# Patient Record
Sex: Male | Born: 1970 | Hispanic: No | Marital: Married | Smoking: Never smoker
Health system: Southern US, Community
[De-identification: ages and names within clinical notes are randomized; demographics above are authoritative.]

## PROBLEM LIST (undated history)

## (undated) DIAGNOSIS — R1084 Generalized abdominal pain: Secondary | ICD-10-CM

## (undated) DIAGNOSIS — K5909 Other constipation: Secondary | ICD-10-CM

## (undated) DIAGNOSIS — B009 Herpesviral infection, unspecified: Secondary | ICD-10-CM

## (undated) DIAGNOSIS — E785 Hyperlipidemia, unspecified: Secondary | ICD-10-CM

## (undated) DIAGNOSIS — R071 Chest pain on breathing: Secondary | ICD-10-CM

## (undated) DIAGNOSIS — K5900 Constipation, unspecified: Secondary | ICD-10-CM

## (undated) DIAGNOSIS — K219 Gastro-esophageal reflux disease without esophagitis: Secondary | ICD-10-CM

## (undated) DIAGNOSIS — M25569 Pain in unspecified knee: Secondary | ICD-10-CM

## (undated) DIAGNOSIS — M1710 Unilateral primary osteoarthritis, unspecified knee: Secondary | ICD-10-CM

## (undated) DIAGNOSIS — D473 Essential (hemorrhagic) thrombocythemia: Secondary | ICD-10-CM

## (undated) DIAGNOSIS — F411 Generalized anxiety disorder: Secondary | ICD-10-CM

## (undated) HISTORY — DX: Chest pain on breathing: R07.1

## (undated) HISTORY — DX: Gastro-esophageal reflux disease without esophagitis: K21.9

## (undated) HISTORY — DX: Constipation, unspecified: K59.00

## (undated) HISTORY — DX: Generalized anxiety disorder: F41.1

## (undated) HISTORY — PX: OTHER SURGICAL HISTORY: SHX169

## (undated) HISTORY — DX: Essential (hemorrhagic) thrombocythemia: D47.3

## (undated) HISTORY — DX: Other constipation: K59.09

## (undated) HISTORY — DX: Unilateral primary osteoarthritis, unspecified knee: M17.10

## (undated) HISTORY — DX: Pain in unspecified knee: M25.569

## (undated) HISTORY — DX: Generalized abdominal pain: R10.84

## (undated) HISTORY — DX: Hyperlipidemia, unspecified: E78.5

## (undated) HISTORY — DX: Herpesviral infection, unspecified: B00.9

---

## 1999-06-30 HISTORY — PX: ANTERIOR CRUCIATE LIGAMENT REPAIR: SHX115

## 2006-05-03 ENCOUNTER — Ambulatory Visit: Payer: Self-pay | Admitting: Internal Medicine

## 2006-05-14 ENCOUNTER — Ambulatory Visit: Payer: Self-pay | Admitting: Internal Medicine

## 2006-05-14 LAB — CONVERTED CEMR LAB
ALT: 13 units/L (ref 0–40)
Albumin: 4.2 g/dL (ref 3.5–5.2)
Basophils Absolute: 0.1 10*3/uL (ref 0.0–0.1)
Basophils Relative: 1.7 % — ABNORMAL HIGH (ref 0.0–1.0)
CO2: 33 meq/L — ABNORMAL HIGH (ref 19–32)
Cholesterol: 147 mg/dL (ref 0–200)
Creatinine, Ser: 1.3 mg/dL (ref 0.4–1.5)
Eosinophil percent: 2.8 % (ref 0.0–5.0)
HCT: 44.4 % (ref 39.0–52.0)
Hemoglobin: 15.2 g/dL (ref 13.0–17.0)
LDL Cholesterol: 89 mg/dL (ref 0–99)
Neutrophils Relative %: 57.1 % (ref 43.0–77.0)
Platelets: 167 10*3/uL (ref 150–400)
Potassium: 3.3 meq/L — ABNORMAL LOW (ref 3.5–5.1)
RBC: 4.55 M/uL (ref 4.22–5.81)
Sodium: 142 meq/L (ref 135–145)
TSH: 2.82 microintl units/mL (ref 0.35–5.50)
VLDL: 10 mg/dL (ref 0–40)
WBC: 5.4 10*3/uL (ref 4.5–10.5)

## 2006-05-26 ENCOUNTER — Ambulatory Visit: Payer: Self-pay | Admitting: Internal Medicine

## 2007-06-10 ENCOUNTER — Ambulatory Visit: Payer: Self-pay | Admitting: Internal Medicine

## 2007-06-10 DIAGNOSIS — B009 Herpesviral infection, unspecified: Secondary | ICD-10-CM | POA: Insufficient documentation

## 2007-06-10 DIAGNOSIS — R1084 Generalized abdominal pain: Secondary | ICD-10-CM

## 2007-06-10 DIAGNOSIS — K5909 Other constipation: Secondary | ICD-10-CM

## 2007-06-10 HISTORY — DX: Generalized abdominal pain: R10.84

## 2007-06-10 HISTORY — DX: Herpesviral infection, unspecified: B00.9

## 2007-06-10 HISTORY — DX: Other constipation: K59.09

## 2007-06-27 ENCOUNTER — Ambulatory Visit: Payer: Self-pay | Admitting: Cardiology

## 2007-07-01 ENCOUNTER — Telehealth: Payer: Self-pay | Admitting: Internal Medicine

## 2007-07-15 ENCOUNTER — Ambulatory Visit: Payer: Self-pay | Admitting: Internal Medicine

## 2007-07-15 DIAGNOSIS — M1710 Unilateral primary osteoarthritis, unspecified knee: Secondary | ICD-10-CM

## 2007-07-15 HISTORY — DX: Unilateral primary osteoarthritis, unspecified knee: M17.10

## 2007-10-14 ENCOUNTER — Ambulatory Visit: Payer: Self-pay | Admitting: Internal Medicine

## 2007-10-14 DIAGNOSIS — K219 Gastro-esophageal reflux disease without esophagitis: Secondary | ICD-10-CM | POA: Insufficient documentation

## 2007-10-14 HISTORY — DX: Gastro-esophageal reflux disease without esophagitis: K21.9

## 2007-10-31 ENCOUNTER — Encounter: Payer: Self-pay | Admitting: Internal Medicine

## 2007-11-28 ENCOUNTER — Ambulatory Visit: Payer: Self-pay | Admitting: Gastroenterology

## 2007-11-28 DIAGNOSIS — K5900 Constipation, unspecified: Secondary | ICD-10-CM

## 2007-11-28 HISTORY — DX: Constipation, unspecified: K59.00

## 2007-12-02 ENCOUNTER — Ambulatory Visit: Payer: Self-pay | Admitting: Gastroenterology

## 2007-12-02 ENCOUNTER — Encounter: Payer: Self-pay | Admitting: Gastroenterology

## 2007-12-05 ENCOUNTER — Encounter: Payer: Self-pay | Admitting: Gastroenterology

## 2007-12-06 ENCOUNTER — Telehealth: Payer: Self-pay | Admitting: Gastroenterology

## 2007-12-20 ENCOUNTER — Ambulatory Visit: Payer: Self-pay | Admitting: Gastroenterology

## 2007-12-20 LAB — CONVERTED CEMR LAB: OCCULT 1: NEGATIVE

## 2008-01-04 ENCOUNTER — Ambulatory Visit: Payer: Self-pay | Admitting: Internal Medicine

## 2008-03-09 ENCOUNTER — Encounter (INDEPENDENT_AMBULATORY_CARE_PROVIDER_SITE_OTHER): Payer: Self-pay | Admitting: Occupational Medicine

## 2008-05-02 ENCOUNTER — Ambulatory Visit: Payer: Self-pay | Admitting: Internal Medicine

## 2008-05-02 DIAGNOSIS — M25569 Pain in unspecified knee: Secondary | ICD-10-CM

## 2008-05-02 HISTORY — DX: Pain in unspecified knee: M25.569

## 2008-06-12 ENCOUNTER — Telehealth: Payer: Self-pay | Admitting: Internal Medicine

## 2008-09-24 ENCOUNTER — Ambulatory Visit: Payer: Self-pay | Admitting: Internal Medicine

## 2008-09-24 DIAGNOSIS — K1120 Sialoadenitis, unspecified: Secondary | ICD-10-CM | POA: Insufficient documentation

## 2008-10-08 ENCOUNTER — Ambulatory Visit: Payer: Self-pay | Admitting: Internal Medicine

## 2008-10-08 DIAGNOSIS — D2370 Other benign neoplasm of skin of unspecified lower limb, including hip: Secondary | ICD-10-CM | POA: Insufficient documentation

## 2008-10-26 ENCOUNTER — Ambulatory Visit: Payer: Self-pay | Admitting: Internal Medicine

## 2008-10-26 LAB — CONVERTED CEMR LAB
AST: 21 units/L (ref 0–37)
Albumin: 4 g/dL (ref 3.5–5.2)
Alkaline Phosphatase: 36 units/L — ABNORMAL LOW (ref 39–117)
BUN: 12 mg/dL (ref 6–23)
Bilirubin, Direct: 0.1 mg/dL (ref 0.0–0.3)
Blood in Urine, dipstick: NEGATIVE
Calcium: 9.3 mg/dL (ref 8.4–10.5)
Cholesterol: 144 mg/dL (ref 0–200)
Creatinine, Ser: 1.2 mg/dL (ref 0.4–1.5)
Eosinophils Absolute: 0.2 10*3/uL (ref 0.0–0.7)
Eosinophils Relative: 3.2 % (ref 0.0–5.0)
GFR calc non Af Amer: 71.23 mL/min (ref >60)
Glucose, Bld: 90 mg/dL (ref 70–99)
Hemoglobin: 14.6 g/dL (ref 13.0–17.0)
LDL Cholesterol: 103 mg/dL — ABNORMAL HIGH (ref 0–99)
Lymphocytes Relative: 30.8 % (ref 12.0–46.0)
Nitrite: NEGATIVE
Potassium: 4.2 meq/L (ref 3.5–5.1)
Protein, U semiquant: NEGATIVE
Total Bilirubin: 1 mg/dL (ref 0.3–1.2)
Total CHOL/HDL Ratio: 5
VLDL: 11.8 mg/dL (ref 0.0–40.0)
WBC: 5 10*3/uL (ref 4.5–10.5)
pH: 7

## 2008-11-02 ENCOUNTER — Ambulatory Visit: Payer: Self-pay | Admitting: Internal Medicine

## 2008-11-14 ENCOUNTER — Telehealth (INDEPENDENT_AMBULATORY_CARE_PROVIDER_SITE_OTHER): Payer: Self-pay | Admitting: *Deleted

## 2009-02-16 IMAGING — CT CT ABDOMEN W/ CM
2 of 6 series · 17 of 46 positions shown, 19 images · IV contrast (omnipaque)
Comparison: None.

CLINICAL DATA: Left-sided abdominal pain, constipation and diarrhea.
 ABDOMEN CT WITH CONTRAST:
TECHNIQUE: Multidetector CT imaging of the abdomen was performed following the standard protocol during bolus administration of intravenous contrast.
 Contrast:  125 cc Omnipaque 300.
TECHNIQUE: Multidetector CT imaging of the pelvis was performed following the standard protocol during bolus administration of intravenous contrast.

[Series 4: abd_pel_xxl 2.0 b10f st · axial · 0.73mm/px · z∈[-358,+42]mm · 14 of 438 slices shown, 16 images]
[im 19/438  soft-tissue]
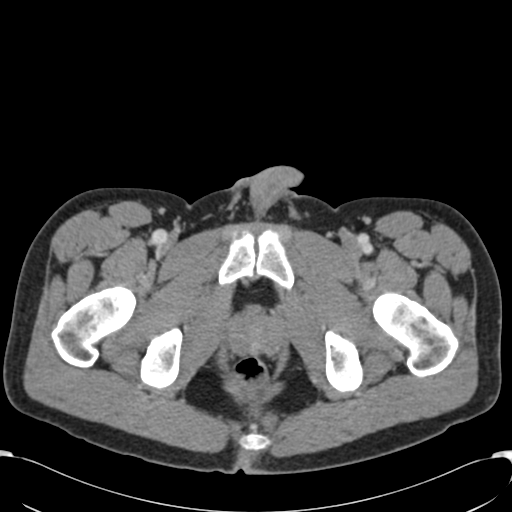
[im 19/438  bone]
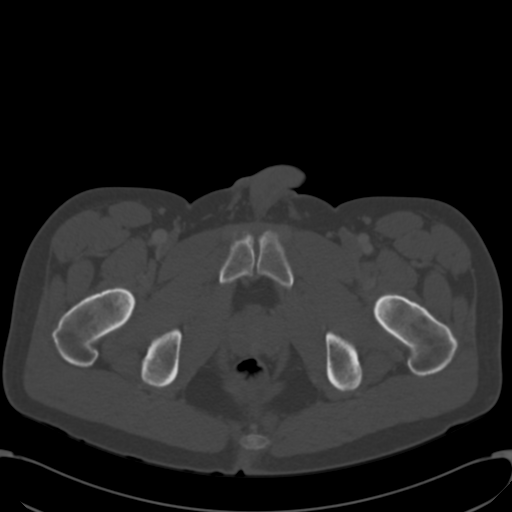
[im 55/438  soft-tissue]
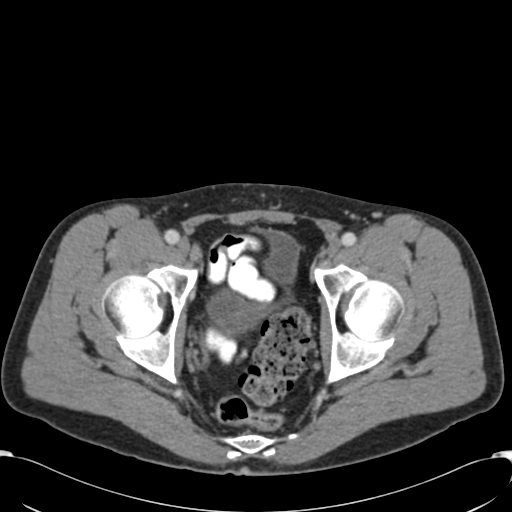
[im 92/438  soft-tissue]
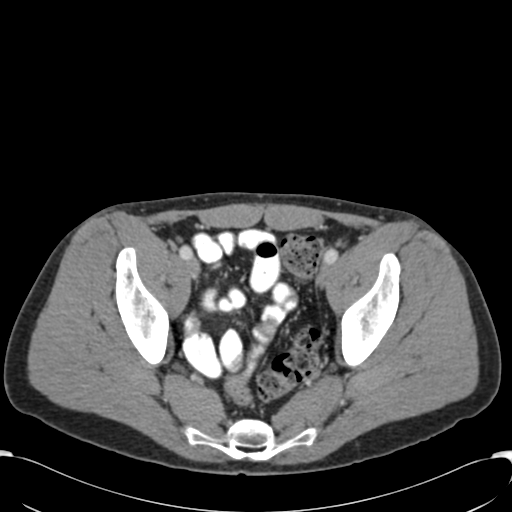
[im 110/438  soft-tissue]
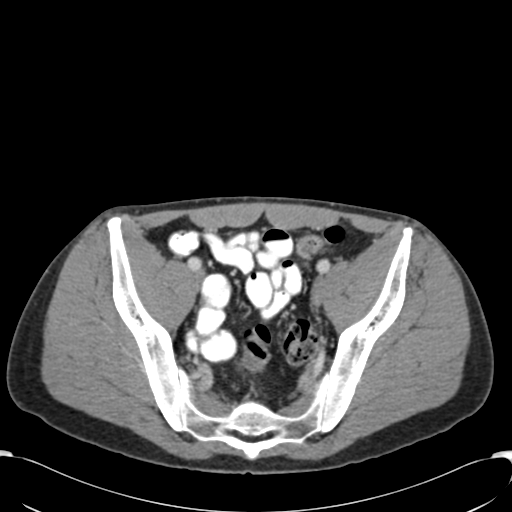
[im 146/438  soft-tissue]
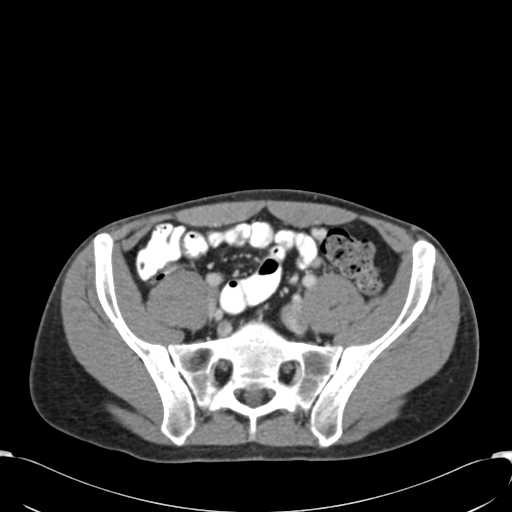
[im 183/438  soft-tissue]
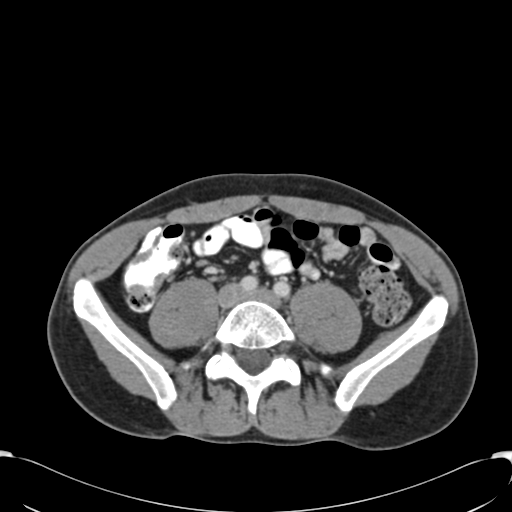
[im 201/438  soft-tissue]
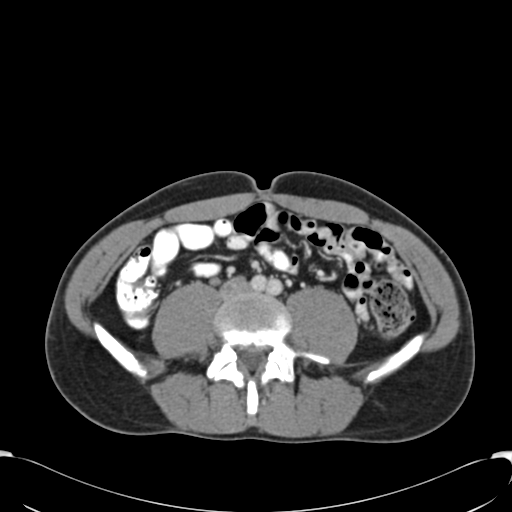
[im 237/438  soft-tissue]
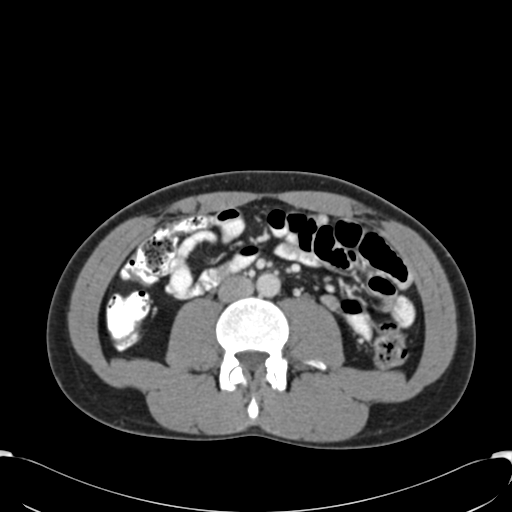
[im 255/438  soft-tissue]
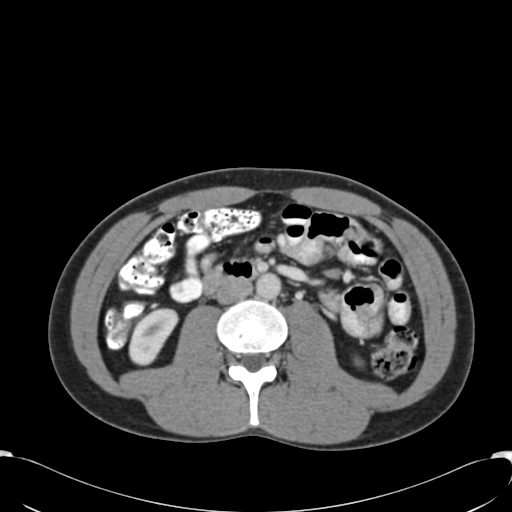
[im 255/438  bone]
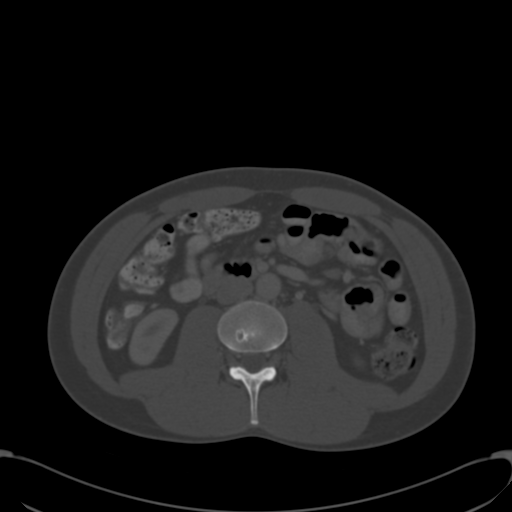
[im 292/438  soft-tissue]
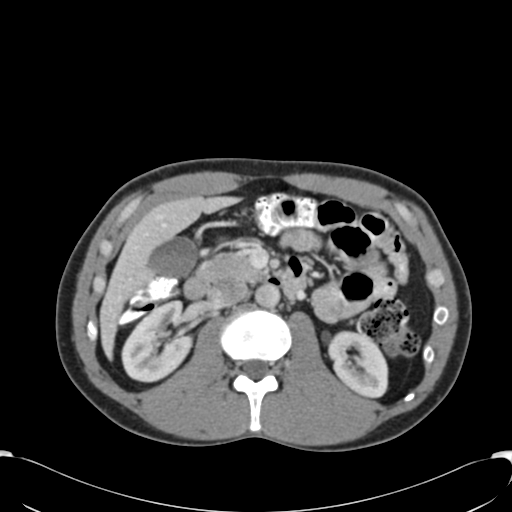
[im 328/438  soft-tissue]
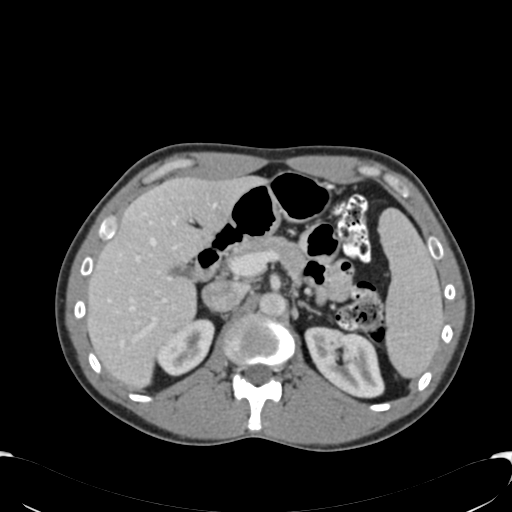
[im 346/438  soft-tissue]
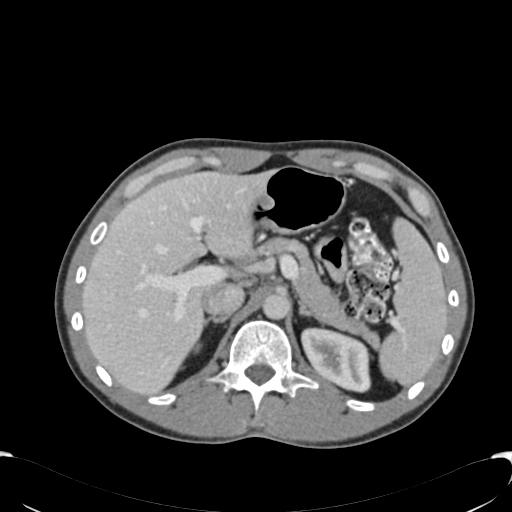
[im 383/438  soft-tissue]
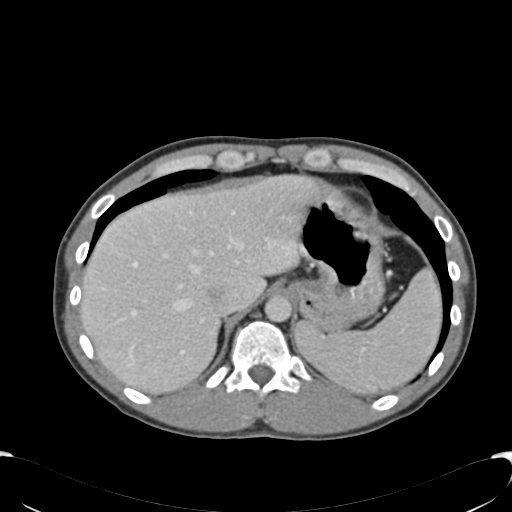
[im 419/438  soft-tissue]
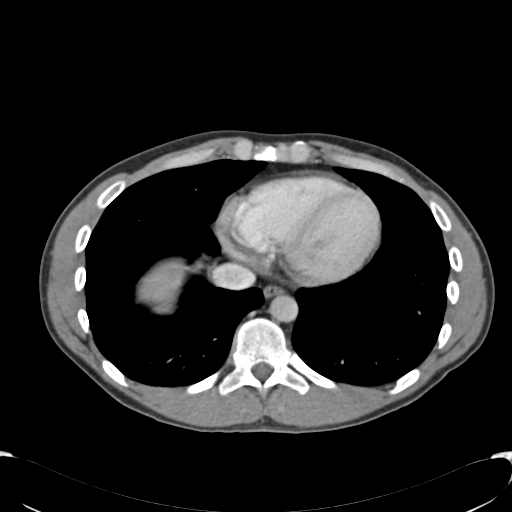

[Series 602: <mpr thick range> · coronal · 0.86mm/px · 3 of 72 slices shown]
[im 24/72  soft-tissue]
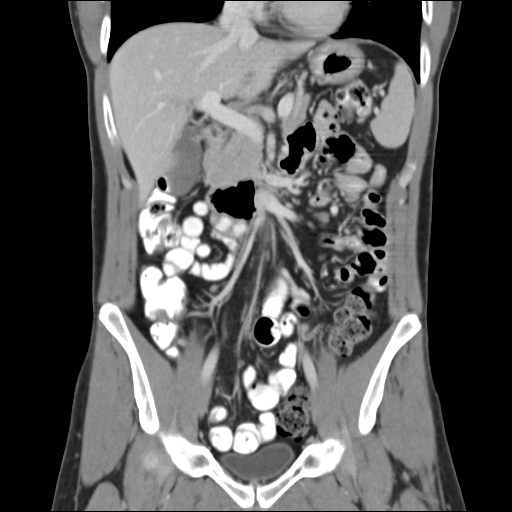
[im 32/72  soft-tissue]
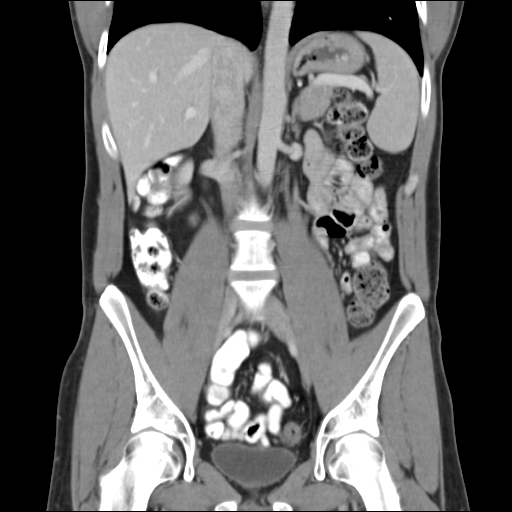
[im 40/72  soft-tissue]
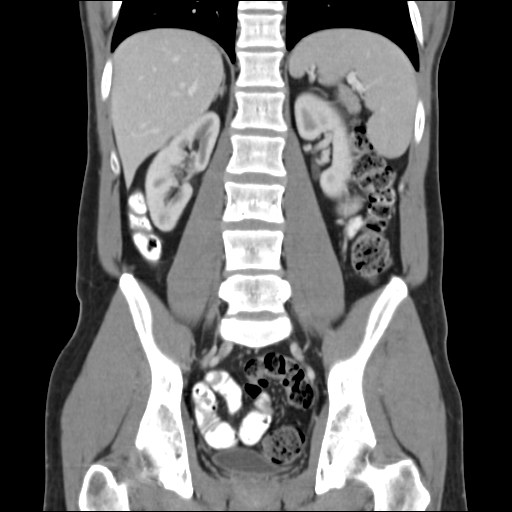

[17 of 46 positions shown; findings below may reference images not displayed]

FINDINGS: Lung bases are clear.  The liver enhances with no focal abnormality and no ductal dilatation is seen.  No calcified gallstones are noted.  The pancreas is normal in size and the pancreatic duct is not dilated.  The adrenal glands and spleen appear normal.  The kidneys enhance, and on delayed images, the pelvocaliceal systems appear normal.  The abdominal aorta is normal in caliber.
IMPRESSION: Negative CT of the abdomen.
 PELVIS CT WITH CONTRAST:
FINDINGS: The appendix is well seen and appears normal.  The urinary bladder is decompressed but no gross abnormality ix noted.  The prostate is normal in size.  No pelvic mass or fluid is seen and no adenopathy is noted.  There is feces throughout the colon but no colonic abnormality is evident.  Terminal ileum appears grossly normal.
IMPRESSION: No acute abnormality.  Terminal ileum and appendix appear normal.  A few small nodes in the right lower quadrant of questionable significance.

## 2009-05-09 ENCOUNTER — Ambulatory Visit: Payer: Self-pay | Admitting: Internal Medicine

## 2009-05-09 LAB — CONVERTED CEMR LAB
AST: 19 units/L (ref 0–37)
Albumin: 4.3 g/dL (ref 3.5–5.2)
Alkaline Phosphatase: 37 units/L — ABNORMAL LOW (ref 39–117)
Cholesterol: 165 mg/dL (ref 0–200)

## 2009-06-04 ENCOUNTER — Ambulatory Visit: Payer: Self-pay | Admitting: Internal Medicine

## 2009-06-04 DIAGNOSIS — E785 Hyperlipidemia, unspecified: Secondary | ICD-10-CM

## 2009-06-04 HISTORY — DX: Hyperlipidemia, unspecified: E78.5

## 2009-10-11 ENCOUNTER — Ambulatory Visit: Payer: Self-pay | Admitting: Internal Medicine

## 2009-10-11 DIAGNOSIS — R071 Chest pain on breathing: Secondary | ICD-10-CM | POA: Insufficient documentation

## 2009-10-11 HISTORY — DX: Chest pain on breathing: R07.1

## 2010-01-28 ENCOUNTER — Ambulatory Visit: Payer: Self-pay | Admitting: Internal Medicine

## 2010-01-28 LAB — CONVERTED CEMR LAB
ALT: 19 units/L (ref 0–53)
BUN: 14 mg/dL (ref 6–23)
Basophils Absolute: 0 10*3/uL (ref 0.0–0.1)
Basophils Relative: 0.6 % (ref 0.0–3.0)
Bilirubin Urine: NEGATIVE
Bilirubin, Direct: 0.2 mg/dL (ref 0.0–0.3)
Blood in Urine, dipstick: NEGATIVE
CO2: 29 meq/L (ref 19–32)
Calcium: 9.2 mg/dL (ref 8.4–10.5)
Eosinophils Relative: 3 % (ref 0.0–5.0)
GFR calc non Af Amer: 74.35 mL/min (ref >60)
Glucose, Urine, Semiquant: NEGATIVE
Ketones, urine, test strip: NEGATIVE
Lymphocytes Relative: 37.3 % (ref 12.0–46.0)
Lymphs Abs: 2.3 10*3/uL (ref 0.7–4.0)
MCHC: 35.3 g/dL (ref 30.0–36.0)
Monocytes Absolute: 0.5 10*3/uL (ref 0.1–1.0)
Monocytes Relative: 8.6 % (ref 3.0–12.0)
Platelets: 139 10*3/uL — ABNORMAL LOW (ref 150.0–400.0)
RDW: 13.1 % (ref 11.5–14.6)
Sodium: 143 meq/L (ref 135–145)
Total CHOL/HDL Ratio: 4
Total Protein: 6.8 g/dL (ref 6.0–8.3)
WBC Urine, dipstick: NEGATIVE

## 2010-02-04 ENCOUNTER — Ambulatory Visit: Payer: Self-pay | Admitting: Internal Medicine

## 2010-02-04 DIAGNOSIS — D473 Essential (hemorrhagic) thrombocythemia: Secondary | ICD-10-CM | POA: Insufficient documentation

## 2010-02-04 HISTORY — DX: Essential (hemorrhagic) thrombocythemia: D47.3

## 2010-06-06 ENCOUNTER — Telehealth: Payer: Self-pay | Admitting: Internal Medicine

## 2010-06-06 DIAGNOSIS — F411 Generalized anxiety disorder: Secondary | ICD-10-CM | POA: Insufficient documentation

## 2010-06-06 HISTORY — DX: Generalized anxiety disorder: F41.1

## 2010-07-29 NOTE — Assessment & Plan Note (Signed)
Summary: cpx/mm pt rsc/njr   Vital Signs:  Patient profile:   40 year old male Height:      74 inches Weight:      190 pounds BMI:     24.48 Temp:     98.2 degrees F oral Pulse rate:   72 / minute Resp:     12 per minute BP sitting:   110 / 76  (left arm)  Vitals Entered By: Willy Eddy, LPN (February 04, 2010 2:24 PM) CC: cpx Is Patient Diabetic? No   Primary Care Tocarra Gassen:  Darryll Capers MD  CC:  cpx.  History of Present Illness: The pt was asked about all immunizations, health maint. services that are appropriate to their age and was given guidance on diet exercize  and weight management Gerd STABLE LIPIDS AT GOAL  the platlet cought is slightly low discussed monitering no fm hx of bleeding disorders no signes of bleeding no suspect medications  Preventive Screening-Counseling & Management  Alcohol-Tobacco     Smoking Status: never     Passive Smoke Exposure: no  Problems Prior to Update: 1)  Chest Wall Pain, Anterior  (ICD-786.52) 2)  Hyperlipidemia, Mild, With Low Hdl  (ICD-272.4) 3)  Dermatofibroma, Leg, Right  (ICD-216.7) 4)  Sialadenitis, Right  (ICD-527.2) 5)  Knee Pain, Left, Chronic  (ICD-719.46) 6)  Routine General Medical Exam@health  Care [facility]  (ICD-V70.0) 7)  Constipation  (ICD-564.00) 8)  Gastroesophageal Reflux Disease, Severe  (ICD-530.81) 9)  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  (ICD-715.36) 10)  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) 11)  Abdominal Pain, Generalized  (ICD-789.07) 12)  Constipation, Chronic  (ICD-564.09)  Current Problems (verified): 1)  Chest Wall Pain, Anterior  (ICD-786.52) 2)  Hyperlipidemia, Mild, With Low Hdl  (ICD-272.4) 3)  Dermatofibroma, Leg, Right  (ICD-216.7) 4)  Sialadenitis, Right  (ICD-527.2) 5)  Knee Pain, Left, Chronic  (ICD-719.46) 6)  Routine General Medical Exam@health  Care [facility]  (ICD-V70.0) 7)  Constipation  (ICD-564.00) 8)  Gastroesophageal Reflux Disease, Severe   (ICD-530.81) 9)  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 1/2 By Mouth Daily 2)  Pantoprazole Sodium 40 Mg Tbec (Pantoprazole Sodium) .Marland Kitchen.. 1 Qod  30 Minutes Before Meal 3)  Caltrate 600+d 600-400 Mg-Unit Tabs (Calcium Carbonate-Vitamin D) .... One By Mouth Daily  Current Medications (verified): 1)  Valtrex 1 Gm  Tabs (Valacyclovir Hcl) .... 1/2 By Mouth Daily 2)  Protonix 20 Mg Tbec (Pantoprazole Sodium) .Marland Kitchen.. 1 Once Daily 3)  Caltrate 600+d 600-400 Mg-Unit Tabs (Calcium Carbonate-Vitamin D) .... 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  Risk Factors: Exercise: yes (11/28/2007)  Risk Factors: Smoking Status: never (02/04/2010) Passive Smoke Exposure: no (02/04/2010)  Past medical, surgical, family and social histories (including risk factors) reviewed, and no changes noted (except as noted below).  Past Medical History: Reviewed  history from 11/28/2007 and no changes required. 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 fibroid cyst removed from neck Knee Arthroscopy  Family History: Reviewed history from 10/14/2007 and no changes required. mother preleukemia  possible myeloproliferative   renal failure HTN MVP,Depression hyperlipidemia PUD, CVA,   early ADOM  father Family History Hypertension  Social History: Reviewed history from 11/28/2007 and no changes required. 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  Review of Systems  The patient denies anorexia, fever, weight loss, weight gain, vision loss, decreased hearing, hoarseness, chest pain, syncope, dyspnea on exertion, peripheral edema, prolonged cough, headaches, hemoptysis, abdominal pain, melena, hematochezia, severe indigestion/heartburn, hematuria, incontinence, genital sores, muscle weakness, suspicious skin lesions, transient blindness, difficulty walking, depression, unusual weight change, abnormal bleeding, enlarged lymph nodes, angioedema, and breast masses.    Physical Exam  General:  Well developed, well nourished, no acute distress. Head:  Normocephalic and atraumatic.male-pattern balding.   Ears:  Normal auditory acuity. Nose:  External nasal examination shows no deformity or inflammation. Nasal mucosa are pink and moist without lesions or exudates. Mouth:  No deformity or lesions, dentition normal. Neck:  No deformities, masses, or tenderness noted. Lungs:  Normal respiratory effort, chest expands symmetrically. Lungs are clear to auscultation, no crackles or wheezes. Heart:  Regular rate and rhythm; no murmurs, rubs,  or bruits. Abdomen:  Soft, nontender and nondistended. No masses, hepatosplenomegaly or hernias noted. Normal bowel sounds. Msk:  Symmetrical with no gross deformities. Normal posture. Pulses:  R and L carotid,radial,femoral,dorsalis pedis and posterior tibial pulses are full and equal bilaterally Extremities:  No clubbing, cyanosis, edema, or deformity noted with normal full range of motion of all joints.   Neurologic:  Alert and  oriented x4;   grossly normal neurologically.   Impression & Recommendations:  Problem # 1:  ROUTINE GENERAL MEDICAL EXAM@[facility]ALTH  CARE [facility] (ICD-V70.0) Assessment Unchanged  Td Booster: Historical (11/24/2005)   Flu Vax: Fluvax 3+ (05/09/2009)   Chol: 165 (05/09/2009)   HDL: 46.30 (05/09/2009)   LDL: 103 (10/26/2008)   TG: 59.0 (10/26/2008) TSH: 1.91 (10/26/2008)    Discussed using sunscreen, use of alcohol, drug use, self testicular exam, routine dental care, routine eye care, routine physical exam, seat belts, multiple vitamins, osteoporosis prevention, adequate calcium intake in diet, and recommendations for immunizations.  Discussed exercise and checking cholesterol.  Discussed gun safety, safe sex, and contraception. Also recommend checking PSA.  Problem # 2:  THROMBOCYTHEMIA (ICD-238.71) there was a slight drop in the platlets wth normal cbc will moniter in 6 months discussed wt pt  Complete Medication List: 1)  Valtrex 1 Gm Tabs (Valacyclovir hcl) .... 1/2 by mouth daily 2)  Protonix 20 Mg Tbec (Pantoprazole sodium) .Marland Kitchen.. 1 once daily 3)  Caltrate 600+d 600-400 Mg-unit Tabs (Calcium carbonate-vitamin d) .... One by mouth daily  Patient Instructions: 1)  Please schedule a follow-up appointment in 6 months. 2)  CBC w/ Diff prior to visit, ICD-9:thrombocythemia

## 2010-07-29 NOTE — Assessment & Plan Note (Signed)
Summary: ? CRACKED RIBS/CJR   Vital Signs:  Patient profile:   40 year old male Height:      74 inches Weight:      185 pounds BMI:     23.84 Temp:     98.2 degrees F oral Pulse rate:   72 / minute Resp:     14 per minute BP sitting:   110 / 70  (left arm)  Vitals Entered By: Willy Eddy, LPN (October 11, 2009 4:17 PM) CC: c/o pain in rt upper chest area -thinks maybe rib injury-    Primary Care Provider:  Darryll Capers MD  CC:  c/o pain in rt upper chest area -thinks maybe rib injury- .  History of Present Illness: started to feel sensation about three weeks ago after a bad cough/ uri no recurrent pain in the rib area sharp, occurs with turningm deep breath bending  the pain in the right anterior chest  Preventive Screening-Counseling & Management  Alcohol-Tobacco     Smoking Status: never     Passive Smoke Exposure: no  Problems Prior to Update: 1)  Hyperlipidemia, Mild, With Low Hdl  (ICD-272.4) 2)  Dermatofibroma, Leg, Right  (ICD-216.7) 3)  Sialadenitis, Right  (ICD-527.2) 4)  Knee Pain, Left, Chronic  (ICD-719.46) 5)  Routine General Medical Exam@health  Care Fac[facility]  (ICD-V70.0) 6)  Constipation  (ICD-564.00) 7)  Gastroesophageal Reflux Disease, Severe  (ICD-530.81) 8)  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  (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) 9)  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) 10)  Abdominal Pain, Generalized  (ICD-789.07) 11)  Constipation, Chronic  (ICD-564.09)  Current Problems (verified): 1)  Hyperlipidemia, Mild, With Low Hdl  (ICD-272.4) 2)  Dermatofibroma, Leg, Right  (ICD-216.7) 3)  Sialadenitis, Right  (ICD-527.2) 4)  Knee Pain, Left, Chronic  (ICD-719.46) 5)  Routine General Medical Exam@health  Care [facility]  (ICD-V70.0) 6)  Constipation  (ICD-564.00) 7)  Gastroesophageal Reflux Disease, Severe  (ICD-530.81) 8)  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 1/2 By Mouth Daily 2)  Pantoprazole Sodium 40 Mg Tbec (Pantoprazole Sodium) .Marland Kitchen.. 1 Qod  30 Minutes Before Meal 3)  Caltrate 600+d 600-400 Mg-Unit Tabs (Calcium Carbonate-Vitamin D) .... One By Mouth Daily  Current Medications (verified): 1)  Valtrex 1 Gm  Tabs (Valacyclovir Hcl) .... 1/2 By Mouth Daily 2)  Pantoprazole Sodium 40 Mg Tbec (Pantoprazole Sodium) .Marland Kitchen.. 1 Qod  30 Minutes Before Meal 3)  Caltrate 600+d 600-400 Mg-Unit Tabs (Calcium Carbonate-Vitamin D) .... 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  Risk Factors: Exercise: yes (11/28/2007)  Risk Factors: Smoking Status: never (10/11/2009) Passive Smoke Exposure: no (10/11/2009)  Past medical, surgical, family and social histories (including risk factors) reviewed, and no changes noted (except as noted below).  Past Medical History: Reviewed history from 11/28/2007 and no changes required. HSV bilateral reflux of the  ureters as child  Past Surgical History: Reviewed history from 11/28/2007 and no changes required. fibroid cyst removed from neck Knee Arthroscopy  Family History: Reviewed history from 10/14/2007 and no changes required. mother preleukemia  possible myeloproliferative  renal failure HTN MVP,Depression hyperlipidemia PUD, CVA,   early ADOM  father Family  History Hypertension  Social History: Reviewed history from 11/28/2007 and no changes required. 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  Review of Systems  The patient denies anorexia, fever, weight loss, weight gain, vision loss, decreased hearing, hoarseness, chest pain, syncope, dyspnea on exertion, peripheral edema, prolonged cough, headaches, hemoptysis, abdominal pain, melena, hematochezia, severe indigestion/heartburn, hematuria, incontinence, genital sores, muscle weakness, suspicious skin lesions, transient blindness, difficulty walking, depression, unusual weight change, abnormal bleeding, enlarged lymph nodes, angioedema, and breast masses.    Physical Exam  Head:  Normocephalic and atraumatic. Ears:  Normal auditory acuity. Nose:  External nasal examination shows no deformity or inflammation. Nasal mucosa are pink and moist without lesions or exudates. Neck:  No deformities, masses, or tenderness noted. Chest Wall:  tender at 5th rib anterioiry Lungs:  Normal respiratory effort, chest expands symmetrically. Lungs are clear to auscultation, no crackles or wheezes. Heart:  Regular rate and rhythm; no murmurs, rubs,  or bruits.   Impression & Recommendations:  Problem # 1:  CHEST WALL PAIN, ANTERIOR (ICD-786.52) Informed consent obtained and then the rib areawas prepped in a sterile manor and 20 mg depo and 1/2 cc 1% lidocaine injected into the space. After care discussed. Pt tolerated procedure well.  Orders: Trigger Point Injection (1 or 2 muscles) (16109) Depo-Medrol 20mg  (J1020)  Reviewed EKG (see interpretation) and treatment options. Patient instructed to call for worsening pain, or new symptoms.   Complete Medication List: 1)  Valtrex 1 Gm Tabs (Valacyclovir hcl) .... 1/2 by mouth daily 2)  Pantoprazole Sodium 40 Mg Tbec  (Pantoprazole sodium) .Marland Kitchen.. 1 qod  30 minutes before meal 3)  Caltrate 600+d 600-400 Mg-unit Tabs (Calcium carbonate-vitamin d) .... One by mouth daily

## 2010-07-29 NOTE — Progress Notes (Signed)
  Phone Note Call from Patient   Caller: Patient Call For: Stacie Glaze MD Reason for Call: Acute Illness Summary of Call: Pt would like to have a referral to a phychologist, please. [phone] Initial call taken by: Lynann Beaver CMA AAMA,  June 06, 2010 10:11 AM  Follow-up for Phone Call        he can make his own appointment with jennifer for dr Dellia Cloud- jennifer's number is [phone] Follow-up by: Willy Eddy, LPN,  June 06, 2010 10:34 AM  Additional Follow-up for Phone Call Additional follow up Details #1::        Pt is asking for the referral for referral for insurance purposes.  Would like [place] to do it. Additional Follow-up by: Lynann Beaver CMA AAMA,  June 06, 2010 10:36 AM  New Problems: ANXIETY STATE, UNSPECIFIED (ICD-300.00)   New Problems: ANXIETY STATE, UNSPECIFIED (ICD-300.00)

## 2010-08-07 ENCOUNTER — Other Ambulatory Visit (INDEPENDENT_AMBULATORY_CARE_PROVIDER_SITE_OTHER): Payer: BC Managed Care – PPO | Admitting: Internal Medicine

## 2010-08-07 DIAGNOSIS — D473 Essential (hemorrhagic) thrombocythemia: Secondary | ICD-10-CM

## 2010-08-07 LAB — CBC WITH DIFFERENTIAL/PLATELET
Basophils Relative: 0.7 % (ref 0.0–3.0)
Eosinophils Relative: 2.6 % (ref 0.0–5.0)
HCT: 41.6 % (ref 39.0–52.0)
Hemoglobin: 14.6 g/dL (ref 13.0–17.0)
Lymphs Abs: 1.7 10*3/uL (ref 0.7–4.0)
Monocytes Relative: 6.7 % (ref 3.0–12.0)
Neutro Abs: 2.6 10*3/uL (ref 1.4–7.7)
RBC: 4.24 Mil/uL (ref 4.22–5.81)
RDW: 13 % (ref 11.5–14.6)
WBC: 4.8 10*3/uL (ref 4.5–10.5)

## 2010-08-12 ENCOUNTER — Encounter: Payer: Self-pay | Admitting: Internal Medicine

## 2010-08-12 ENCOUNTER — Encounter: Payer: Self-pay | Admitting: *Deleted

## 2010-08-14 ENCOUNTER — Encounter: Payer: Self-pay | Admitting: Internal Medicine

## 2010-08-14 ENCOUNTER — Ambulatory Visit (INDEPENDENT_AMBULATORY_CARE_PROVIDER_SITE_OTHER): Payer: BC Managed Care – PPO | Admitting: Internal Medicine

## 2010-08-14 VITALS — BP 110/70 | HR 72 | Temp 98.0°F | Resp 14 | Ht 74.0 in | Wt 191.0 lb

## 2010-08-14 DIAGNOSIS — R071 Chest pain on breathing: Secondary | ICD-10-CM

## 2010-08-14 DIAGNOSIS — F411 Generalized anxiety disorder: Secondary | ICD-10-CM

## 2010-08-14 DIAGNOSIS — R079 Chest pain, unspecified: Secondary | ICD-10-CM

## 2010-08-14 DIAGNOSIS — Z566 Other physical and mental strain related to work: Secondary | ICD-10-CM

## 2010-08-14 DIAGNOSIS — D473 Essential (hemorrhagic) thrombocythemia: Secondary | ICD-10-CM

## 2010-08-14 DIAGNOSIS — Z5689 Other problems related to employment: Secondary | ICD-10-CM

## 2010-08-14 NOTE — Assessment & Plan Note (Addendum)
Patient presents for followup of laboratory values for thrombocythemia at the connected in the last 2 years.   his platelet count remained stable in the 1:30 range compared to a prior reading of 139 Baseline platelets prior to the diagnoses were 169.  Of note his hemoglobin is normal at 14.5 and his hematocrit is normal at 41 white count is normal with a normal differential his white cells and this has been a previous stable finding.  the patient is not on an aspirin currently we will add an 81 mg low-dose aspirin daily monitor his platelet count in 6 months at the time of his complete physical and we discussed potential differential diagnosis for this problem no other cell population has been affected at this point to monitoring as her best.

## 2010-08-14 NOTE — Assessment & Plan Note (Addendum)
He has atypical chest pain in the left chest nonradiating at rest he has been under increased stress has a new father and also he is up to 10 year this year an EKG will be performed if it is normal reassurance will be given if there is abnormality referral for a stress test will be ordered he is seeking counseling at this point will monitor for resolution of chest pain the chest pain is nonexertional. The EKG showed a single rare PVC otherwise was normal sinus rhythm and all the leads

## 2010-08-14 NOTE — Progress Notes (Signed)
  Subjective:    Patient ID: Keith Benton, male    DOB: 1971-05-03, 40 y.o.   MRN: [id number]  HPI this 40 year old white male who presents to the clinic for followup of essential thrombocythemia that was detected over one year ago we have been monitoring his platelets and they are stable. He has no symptoms specifically no bleeding disorders or any family history of bleeding disorders 2 years ago his platelet count was normal in the 170 range.   He also has increased stressors in his life with a new child and he is a 14 year this year at work.  He is at requested a referral to psychology for counseling.   Patient also has experienced atypical chest pain as left anterior chest this is at rest this is nonexertional it does not happen with  exercise    Review of Systems  Constitutional: Negative for fever and fatigue.  HENT: Negative for hearing loss, congestion, neck pain and postnasal drip.   Eyes: Negative for discharge, redness and visual disturbance.  Respiratory: Negative for cough, shortness of breath and wheezing.   Cardiovascular: Negative for leg swelling.  Gastrointestinal: Negative for abdominal pain, constipation and abdominal distention.  Genitourinary: Negative for urgency and frequency.  Musculoskeletal: Negative for joint swelling and arthralgias.  Skin: Negative for color change and rash.  Neurological: Negative for weakness and light-headedness.  Hematological: Negative for adenopathy.  Psychiatric/Behavioral: Negative for behavioral problems.       Past Medical History  Diagnosis Date  . Abdominal pain, generalized 06/10/2007  . Anxiety state, unspecified 06/06/2010  . CHEST WALL PAIN, ANTERIOR 10/11/2009  . CONSTIPATION, CHRONIC 06/10/2007  . CONSTIPATION 11/28/2007  . GASTROESOPHAGEAL REFLUX DISEASE, SEVERE 10/14/2007  . Herpes simplex without mention of complication 06/10/2007  . HYPERLIPIDEMIA, MILD, WITH LOW HDL 06/04/2009  . KNEE PAIN, LEFT, CHRONIC 05/02/2008   . LOC OSTEOARTHROS NOT SPEC PRIM/SEC LOWER LEG 07/15/2007  . THROMBOCYTHEMIA 02/04/2010   Past Surgical History  Procedure Date  . Fibroid cyst removed from neck   . Anterior cruciate ligament repair 2001    right    reports that he has never smoked. He does not have any smokeless tobacco history on file. He reports that he drinks about 1.8 ounces of alcohol per week. He reports that he does not use illicit drugs. family history includes Depression in his mother; Diabetes in his mother; Heart disease in his mother; Hyperlipidemia in his mother; Hypertension in his mother; Irregular heart beat in his father; Kidney disease in his mother; Kidney failure in an unspecified family member; and Stroke in his mother.     Objective:   Physical Exam  Constitutional: He is oriented to person, place, and time. He appears well-developed and well-nourished.  HENT:  Head: Normocephalic and atraumatic.  Eyes: Conjunctivae are normal. Pupils are equal, round, and reactive to light.  Neck: Normal range of motion. Neck supple.  Cardiovascular: Normal rate and regular rhythm.   Pulmonary/Chest: Effort normal and breath sounds normal.  Abdominal: Soft. Bowel sounds are normal.  Musculoskeletal: Normal range of motion.        Left anterior chest wall tenderness  Neurological: He is alert and oriented to person, place, and time.  Skin: Skin is warm and dry.  Psychiatric: He has a normal mood and affect. His behavior is normal.          Assessment & Plan:   see problem focused

## 2010-08-14 NOTE — Assessment & Plan Note (Signed)
Is increased stressors at work has asked for referral to psychology for discussion with the counselor we have referred him to Dr. Caralyn Guile

## 2010-09-02 ENCOUNTER — Ambulatory Visit: Payer: BC Managed Care – PPO | Admitting: Psychology

## 2010-09-04 ENCOUNTER — Ambulatory Visit (INDEPENDENT_AMBULATORY_CARE_PROVIDER_SITE_OTHER): Payer: BC Managed Care – PPO | Admitting: Psychology

## 2010-09-04 DIAGNOSIS — F411 Generalized anxiety disorder: Secondary | ICD-10-CM

## 2010-09-11 ENCOUNTER — Ambulatory Visit (INDEPENDENT_AMBULATORY_CARE_PROVIDER_SITE_OTHER): Payer: BC Managed Care – PPO | Admitting: Psychology

## 2010-09-11 DIAGNOSIS — F411 Generalized anxiety disorder: Secondary | ICD-10-CM

## 2010-09-25 ENCOUNTER — Ambulatory Visit (INDEPENDENT_AMBULATORY_CARE_PROVIDER_SITE_OTHER): Payer: BC Managed Care – PPO | Admitting: Psychology

## 2010-09-25 DIAGNOSIS — F411 Generalized anxiety disorder: Secondary | ICD-10-CM

## 2010-10-02 ENCOUNTER — Ambulatory Visit (INDEPENDENT_AMBULATORY_CARE_PROVIDER_SITE_OTHER): Payer: BC Managed Care – PPO | Admitting: Psychology

## 2010-10-02 DIAGNOSIS — F411 Generalized anxiety disorder: Secondary | ICD-10-CM

## 2010-10-09 ENCOUNTER — Encounter: Payer: Self-pay | Admitting: Family Medicine

## 2010-10-09 ENCOUNTER — Ambulatory Visit (INDEPENDENT_AMBULATORY_CARE_PROVIDER_SITE_OTHER): Payer: BC Managed Care – PPO | Admitting: Family Medicine

## 2010-10-09 VITALS — BP 110/70 | Temp 98.9°F

## 2010-10-09 DIAGNOSIS — K1120 Sialoadenitis, unspecified: Secondary | ICD-10-CM

## 2010-10-09 MED ORDER — CEPHALEXIN 500 MG PO CAPS: 500.0000 mg | ORAL_CAPSULE | Freq: Three times a day (TID) | ORAL | Status: AC

## 2010-10-09 NOTE — Progress Notes (Signed)
  Subjective:    Patient ID: Keith Benton, male    DOB: 21-May-1971, 40 y.o.   MRN: [id number]  HPI Recurrent right submandibular swelling. Has had several similar occurences in the past. Most recently 2 years ago. Has responded in the past to Keflex. No erythema or warmth or other indicators of active infection. His first episode of right submandibular swelling was in childhood. He apparently one point had some type of benign cyst removed. He denies any fever chills or other complaints   Review of Systems  Constitutional: Negative for fever and chills.  HENT: Negative for sore throat and trouble swallowing.   Skin: Negative for rash.  Hematological: Negative for adenopathy.       Objective:   Physical Exam  Constitutional: He appears well-developed and well-nourished. No distress.  HENT:  Head: Normocephalic and atraumatic.  Right Ear: External ear normal.  Left Ear: External ear normal.  Mouth/Throat: Oropharynx is clear and moist. No oropharyngeal exudate.  Neck: Neck supple.       Edema Right Submandibular Region. No Erythema or Warmth. Right Submandibular Gland Appears to Be Edematous. No Other Masses Palpated. No Parotid Masses.  Cardiovascular: Normal rate, regular rhythm and normal heart sounds.   Pulmonary/Chest: Effort normal and breath sounds normal. He has no wheezes. He has no rales.  Lymphadenopathy:    He has no cervical adenopathy.          Assessment & Plan:  Right submandibular gland swelling. No signs of abscess at this time. Keflex 500 mg 3 times a day for 10 days. Consider ENT referral if no better in 2 weeks

## 2010-10-09 NOTE — Patient Instructions (Signed)
Sialadenitis (Inflammation of the Salivary Glands) Sialadenitis is an inflammation (soreness) of the salivary glands. The parotid is the main salivary gland. It lies behind the angle of the jaw below the ear. The saliva produced comes out of a tiny opening (duct) inside the cheek on either side. This is usually at the level of the upper back teeth. If it is swollen, the ear is pushed up and out. This helps tell this condition apart from a simple lymph gland infection (swollen glands) in the same area. Mumps has mostly disappeared since the start of immunization against mumps. Now the most common cause of parotitis is germ (bacterial) infection or inflammation of the lymphatics (the lymph channels). The other major salivary gland is located in the floor of the mouth. Smaller salivary glands are located in the mouth. This includes the:  Lips.  Lining of the mouth.   Pharynx.  Hard palate (front part of the roof of the mouth).   The salivary glands do many things including:  Lubrication.   Breaking down food.   Production of hormones and antibodies (to protect against germs which may cause illness).   Help with the sense of taste.  ACUTE BACTERIAL SIALADENITIS This is a sudden inflammatory response to bacterial infection. This causes redness, pain, swelling and tenderness over the infected gland. In the past, it was common in dehydrated and debilitated patients often following an operation. It is now more commonly seen:  After radiotherapy.   In patients with poor immune systems.  Treatment is:  The correction of fluid balance (rehydration).   Medicine that kill germs (antibiotics).   Pain relief.  CHRONIC RECURRENT SIALADENITIS This refers to repeated episodes of discomfort and swelling of one of the salivary glands. It often occurs after eating. Chronic sialadenitis is usually less painful. It is associated with recurrent enlargement of a salivary gland, often following meals, and  typically with an absence of redness. The chronic form of the disease often is associated with conditions linked to decreased salivary flow, rather than dehydration (loss of body fluids). These conditions include:  A stone, or concretion, formed in the gallbladder, kidneys, or other parts of the body (calculi).   Salivary stasis.   A change in the fluid and electrolyte (the salts in your body fluids) makeup of the gland.  It is treated with:  Gland massage.   Methods to stimulate the flow of saliva, (for example, lemon juice).   Antibiotics if required.  Surgery to remove the gland is possible, but its benefits need to be balanced against risks.  VIRAL SIALADENITIS Several viruses infect the salivary glands. Some of these include the mumps virus that commonly infects the parotid gland. Other viruses causing problems are:  The HIV virus.   Herpes.   Some of the influenza ("flu") viruses.  RECURRENT SIALADENITIS IN CHILDREN This condition is thought to be due to swelling or ballooning of the ducts. It results in the same symptoms as acute bacterial parotitis. It is usually caused by germs (bacteria). It is often treated using penicillin. It may get well without treatment. Surgery is usually not required. TUBERCULOUS SIALADENITIS The salivary glands may become infected with the same bacteria causing tuberculosis ("TB"). Treatment is with anti-tuberculous antibiotic therapy. OTHER UNCOMMON CAUSES OF SIALADENITIS   Sjogren's syndrome is a condition in which arthritis is associated with a decrease in activity of the glands of the body that produce saliva and tears. The diagnosis is made with blood tests or by examination of  a piece of tissue from the inside of the lip. Some people with this condition are bothered by:   A dry mouth.   Intermittent salivary gland enlargement.   Atypical mycobacteria is a germ similar to tuberculosis. It often infects children. It is often resistant to  antibiotic treatment. It may require surgical treatment to remove the infected salivary gland.   Actinomycosis is an infection of the parotid gland that may also involve the overlying skin. The diagnosis is made by detecting granules of sulphur produced by the bacteria on microscopic examination. Treatment is a prolonged course of penicillin for up to one year.   Nutritional causes include vitamin deficiencies and bulimia.   Diabetes and problems with your thyroid.   Obesity, cirrhosis, and malabsorption are some metabolic causes.  HOME CARE INSTRUCTIONS  Apply ice bags every 2 hours for 15 minutes, while awake, to the sore gland for 24 hours, then as directed by your caregiver. Place the ice in a plastic bag with a towel around it to prevent frostbite to the skin.   Only take over-the-counter or prescription medicines for pain, discomfort, or fever as directed by your caregiver.  SEEK IMMEDIATE MEDICAL CARE IF:  There is increased pain or swelling in your gland that is not controlled with medicine.   An oral temperature above 101 develops, not controlled by medicine.   You develop difficulty opening your mouth, swallowing or speaking.  Document Released: 12/05/2001 Document Re-Released: 09/09/2009 [facility] Patient Information 2011 [place], [place].

## 2010-10-10 ENCOUNTER — Ambulatory Visit (INDEPENDENT_AMBULATORY_CARE_PROVIDER_SITE_OTHER): Payer: BC Managed Care – PPO | Admitting: Psychology

## 2010-10-10 DIAGNOSIS — F411 Generalized anxiety disorder: Secondary | ICD-10-CM

## 2010-10-18 ENCOUNTER — Other Ambulatory Visit: Payer: Self-pay | Admitting: Internal Medicine

## 2010-10-22 ENCOUNTER — Ambulatory Visit (INDEPENDENT_AMBULATORY_CARE_PROVIDER_SITE_OTHER): Payer: BC Managed Care – PPO | Admitting: Psychology

## 2010-10-22 DIAGNOSIS — F411 Generalized anxiety disorder: Secondary | ICD-10-CM

## 2010-11-03 ENCOUNTER — Ambulatory Visit (INDEPENDENT_AMBULATORY_CARE_PROVIDER_SITE_OTHER): Payer: BC Managed Care – PPO | Admitting: Psychology

## 2010-11-03 DIAGNOSIS — F411 Generalized anxiety disorder: Secondary | ICD-10-CM

## 2010-11-05 ENCOUNTER — Other Ambulatory Visit (INDEPENDENT_AMBULATORY_CARE_PROVIDER_SITE_OTHER): Payer: BC Managed Care – PPO | Admitting: Internal Medicine

## 2010-11-05 DIAGNOSIS — D473 Essential (hemorrhagic) thrombocythemia: Secondary | ICD-10-CM

## 2010-11-05 LAB — CBC WITH DIFFERENTIAL/PLATELET
Basophils Relative: 0.6 % (ref 0.0–3.0)
Eosinophils Relative: 3.5 % (ref 0.0–5.0)
HCT: 41.4 % (ref 39.0–52.0)
Hemoglobin: 14.6 g/dL (ref 13.0–17.0)
MCV: 98.4 fl (ref 78.0–100.0)
Monocytes Absolute: 0.4 10*3/uL (ref 0.1–1.0)
Neutrophils Relative %: 55.7 % (ref 43.0–77.0)
RBC: 4.21 Mil/uL — ABNORMAL LOW (ref 4.22–5.81)
WBC: 5.6 10*3/uL (ref 4.5–10.5)

## 2010-11-12 ENCOUNTER — Encounter: Payer: Self-pay | Admitting: Internal Medicine

## 2010-11-12 ENCOUNTER — Ambulatory Visit (INDEPENDENT_AMBULATORY_CARE_PROVIDER_SITE_OTHER): Payer: BC Managed Care – PPO | Admitting: Internal Medicine

## 2010-11-12 VITALS — BP 120/70 | HR 68 | Temp 98.2°F | Resp 14 | Ht 74.0 in | Wt 188.0 lb

## 2010-11-12 DIAGNOSIS — F411 Generalized anxiety disorder: Secondary | ICD-10-CM

## 2010-11-12 DIAGNOSIS — K219 Gastro-esophageal reflux disease without esophagitis: Secondary | ICD-10-CM

## 2010-11-12 DIAGNOSIS — R220 Localized swelling, mass and lump, head: Secondary | ICD-10-CM

## 2010-11-12 DIAGNOSIS — D473 Essential (hemorrhagic) thrombocythemia: Secondary | ICD-10-CM

## 2010-11-12 NOTE — Progress Notes (Signed)
  Subjective:    Patient ID: Keith Benton, male    DOB: [birth year], 40 y.o.   MRN: [id number]  HPI The pt has been seen for thrombocytopenia and was placed on ASA The pt has a lump in his salivary gland The patient has recurrent mass or sense of fullness and he has right salivary gland (submandibular) he was treated initially over a year ago with an antibiotic and a fullness in the parotid resolved he had recurrent symptoms was treated again with an antibiotic by my partner and the symptoms have not fully resolved.      Review of Systems  Constitutional: Negative for fever and fatigue.  HENT: Negative for hearing loss, congestion, neck pain and postnasal drip.   Eyes: Negative for discharge, redness and visual disturbance.  Respiratory: Negative for cough, shortness of breath and wheezing.   Cardiovascular: Negative for leg swelling.  Gastrointestinal: Negative for abdominal pain, constipation and abdominal distention.  Genitourinary: Negative for urgency and frequency.  Musculoskeletal: Negative for joint swelling and arthralgias.  Skin: Negative for color change and rash.  Neurological: Negative for weakness and light-headedness.  Hematological: Negative for adenopathy.  Psychiatric/Behavioral: Negative for behavioral problems.   Past Medical History  Diagnosis Date  . Abdominal pain, generalized 06/10/2007  . Anxiety state, unspecified 06/06/2010  . CHEST WALL PAIN, ANTERIOR 10/11/2009  . CONSTIPATION, CHRONIC 06/10/2007  . CONSTIPATION 11/28/2007  . GASTROESOPHAGEAL REFLUX DISEASE, SEVERE 10/14/2007  . Herpes simplex without mention of complication 06/10/2007  . HYPERLIPIDEMIA, MILD, WITH LOW HDL 06/04/2009  . KNEE PAIN, LEFT, CHRONIC 05/02/2008  . LOC OSTEOARTHROS NOT SPEC PRIM/SEC LOWER LEG 07/15/2007  . THROMBOCYTHEMIA 02/04/2010   Past Surgical History  Procedure Date  . Fibroid cyst removed from neck   . Anterior cruciate ligament repair 2001    right    reports that he  has never smoked. He does not have any smokeless tobacco history on file. He reports that he drinks about 1.8 ounces of alcohol per week. He reports that he does not use illicit drugs. family history includes Depression in his mother; Diabetes in his mother; Heart disease in his mother; Hyperlipidemia in his mother; Hypertension in his mother; Irregular heart beat in his father; Kidney disease in his mother; Kidney failure in an unspecified family member; and Stroke in his mother. No Known Allergies      Objective:   Physical Exam  Constitutional: He is oriented to person, place, and time. He appears well-developed and well-nourished.  HENT:  Head: Normocephalic and atraumatic.  Eyes: Conjunctivae are normal. Pupils are equal, round, and reactive to light.  Neck: Normal range of motion. Neck supple.  Cardiovascular: Normal rate and regular rhythm.   Pulmonary/Chest: Effort normal and breath sounds normal.  Abdominal: Soft. Bowel sounds are normal.  Neurological: He is alert and oriented to person, place, and time.       Palpable submandibular mass nontender less than 1 cm   Assessment & Plan:  So with the persistent enlargement of the submental lymph node or salivary gland that did not respond to an antibiotic treatment this time we'll proceed with an ultrasound to define its size and monitor carefully over the summer to see if it's growing or changing in its tenderness.  After the ultrasound would consider referral to ear nose and throat if the size is greater than a centimeter for a lump that may be lymph node GERD is stable thrombocythemia has responded to aspirin therapy schedule ultrasound followup 2 months

## 2010-11-25 ENCOUNTER — Other Ambulatory Visit: Payer: BC Managed Care – PPO

## 2010-11-27 ENCOUNTER — Ambulatory Visit
Admission: RE | Admit: 2010-11-27 | Discharge: 2010-11-27 | Disposition: A | Discharge status: Home / Self Care | Payer: BC Managed Care – PPO | Source: Ambulatory Visit | Attending: Internal Medicine | Admitting: Internal Medicine

## 2010-11-27 DIAGNOSIS — R220 Localized swelling, mass and lump, head: Secondary | ICD-10-CM

## 2010-12-02 ENCOUNTER — Other Ambulatory Visit: Payer: Self-pay | Admitting: *Deleted

## 2010-12-02 DIAGNOSIS — Q180 Sinus, fistula and cyst of branchial cleft: Secondary | ICD-10-CM

## 2011-01-08 ENCOUNTER — Telehealth: Payer: Self-pay | Admitting: Internal Medicine

## 2011-01-08 NOTE — Telephone Encounter (Signed)
Pt called 7/12 regarding MRI Dr. Lovell Sheehan ordered for a throat cyst. Pt under impression it was not an urgent matter, as he has already had an ultrasound done. Found out that his insurance will not pay for MRI, and that it will cost him approx $900. Pt is wondering if this MRI is necessary, as that is a lot of money. He would like a call back with Dr. Lovell Sheehan' opinion.

## 2011-01-08 NOTE — Telephone Encounter (Signed)
LMTCB

## 2011-01-08 NOTE — Telephone Encounter (Signed)
Per dr Lovell Sheehan- we can repeat ultrasound in 3 months to observe and make sure it is not growing-I will put a reminder on desk top to call him for schedule

## 2011-01-09 NOTE — Telephone Encounter (Deleted)
Pt is calling again about med transition, and his anxiety.

## 2011-01-09 NOTE — Telephone Encounter (Signed)
,  lmom To repeat US in 3 months

## 2011-01-12 ENCOUNTER — Other Ambulatory Visit: Payer: BC Managed Care – PPO

## 2011-01-15 ENCOUNTER — Ambulatory Visit: Payer: BC Managed Care – PPO | Admitting: Internal Medicine

## 2011-01-19 ENCOUNTER — Ambulatory Visit: Payer: BC Managed Care – PPO | Admitting: Internal Medicine

## 2011-01-25 ENCOUNTER — Other Ambulatory Visit: Payer: Self-pay | Admitting: Internal Medicine

## 2011-02-25 ENCOUNTER — Other Ambulatory Visit: Payer: Self-pay | Admitting: *Deleted

## 2011-02-25 DIAGNOSIS — Q182 Other branchial cleft malformations: Secondary | ICD-10-CM

## 2011-03-04 ENCOUNTER — Ambulatory Visit
Admission: RE | Admit: 2011-03-04 | Discharge: 2011-03-04 | Disposition: A | Discharge status: Home / Self Care | Payer: BC Managed Care – PPO | Source: Ambulatory Visit | Attending: Internal Medicine | Admitting: Internal Medicine

## 2011-03-04 DIAGNOSIS — Q182 Other branchial cleft malformations: Secondary | ICD-10-CM

## 2011-03-18 ENCOUNTER — Telehealth: Payer: Self-pay | Admitting: Internal Medicine

## 2011-03-18 ENCOUNTER — Ambulatory Visit (INDEPENDENT_AMBULATORY_CARE_PROVIDER_SITE_OTHER): Payer: BC Managed Care – PPO | Admitting: Internal Medicine

## 2011-03-18 VITALS — BP 130/80 | HR 76 | Temp 98.8°F | Resp 14 | Ht 74.0 in | Wt 183.0 lb

## 2011-03-18 DIAGNOSIS — N419 Inflammatory disease of prostate, unspecified: Secondary | ICD-10-CM

## 2011-03-18 DIAGNOSIS — R300 Dysuria: Secondary | ICD-10-CM

## 2011-03-18 LAB — POCT URINALYSIS DIPSTICK
Bilirubin, UA: NEGATIVE
Glucose, UA: NEGATIVE
Leukocytes, UA: NEGATIVE
Nitrite, UA: NEGATIVE
pH, UA: 7

## 2011-03-18 MED ORDER — CIPROFLOXACIN HCL 500 MG PO TABS: 500.0000 mg | ORAL_TABLET | Freq: Two times a day (BID) | ORAL | Status: AC

## 2011-03-18 NOTE — Telephone Encounter (Signed)
Pt is having burning while urinating. Pt was informed Dr. Lovell Sheehan was booked until November. Pt did not want to see another doctor but requested you contact him.

## 2011-03-18 NOTE — Telephone Encounter (Signed)
OV TODAY GIVEN

## 2011-03-26 ENCOUNTER — Encounter: Payer: Self-pay | Admitting: Internal Medicine

## 2011-03-26 NOTE — Progress Notes (Signed)
  Subjective:    Patient ID: Keith Benton, male    DOB: 09/17/1970, 40 y.o.   MRN: [id number]  HPI Patient presents with a several week history of low grade back pain dysuria sense of frequency his sense of incomplete emptying of the bladder and dribbling at the end of urination.  The patient denies any exposure to sexually transmitted diseases has had no penile or genital lesions and has no risk factors for STD.  Patient has no prior history of prostatitis and no risk factors for opportunistic infection or   Review of Systems  Constitutional: Negative for fever and fatigue.  HENT: Negative for hearing loss, congestion, neck pain and postnasal drip.   Eyes: Negative for discharge, redness and visual disturbance.  Respiratory: Negative for cough, shortness of breath and wheezing.   Cardiovascular: Negative for leg swelling.  Gastrointestinal: Negative for abdominal pain, constipation and abdominal distention.  Genitourinary: Positive for urgency, frequency and testicular pain. Negative for discharge and genital sores.  Musculoskeletal: Negative for joint swelling and arthralgias.  Skin: Negative for color change and rash.  Neurological: Negative for weakness and light-headedness.  Hematological: Negative for adenopathy.  Psychiatric/Behavioral: Negative for behavioral problems.   Past Medical History  Diagnosis Date  . Abdominal pain, generalized 06/10/2007  . Anxiety state, unspecified 06/06/2010  . CHEST WALL PAIN, ANTERIOR 10/11/2009  . CONSTIPATION, CHRONIC 06/10/2007  . CONSTIPATION 11/28/2007  . GASTROESOPHAGEAL REFLUX DISEASE, SEVERE 10/14/2007  . Herpes simplex without mention of complication 06/10/2007  . HYPERLIPIDEMIA, MILD, WITH LOW HDL 06/04/2009  . KNEE PAIN, LEFT, CHRONIC 05/02/2008  . LOC OSTEOARTHROS NOT SPEC PRIM/SEC LOWER LEG 07/15/2007  . THROMBOCYTHEMIA 02/04/2010   Past Surgical History  Procedure Date  . Fibroid cyst removed from neck   . Anterior cruciate  ligament repair 2001    right    reports that he has never smoked. He does not have any smokeless tobacco history on file. He reports that he drinks about 1.8 ounces of alcohol per week. He reports that he does not use illicit drugs. family history includes Depression in his mother; Diabetes in his mother; Heart disease in his mother; Hyperlipidemia in his mother; Hypertension in his mother; Irregular heart beat in his father; Kidney disease in his mother; Kidney failure in an unspecified family member; and Stroke in his mother. No Known Allergies     Objective:   Physical Exam  Constitutional: He appears well-developed and well-nourished.  HENT:  Head: Normocephalic and atraumatic.  Eyes: Conjunctivae are normal. Pupils are equal, round, and reactive to light.  Neck: Normal range of motion. Neck supple.  Cardiovascular: Normal rate and regular rhythm.   Pulmonary/Chest: Effort normal and breath sounds normal.  Abdominal: Soft. Bowel sounds are normal.  Genitourinary:       Body +2 inflamed prostate          Assessment & Plan:  Patient is a 40 year old white male who presents with some dysuria back pain and testicular pain of several weeks' duration. This is most likely prostatitis physical examination presentation concurs we will treat him with a 3 week course of ciprofloxacin if symptoms are persisting consider referral to urology he discussed in detail the etiology of prostatitis and a nail as well as the risks of chronic prostatitis after diagnosis of acute prostatitis.  I have spent more than 30 minutes examining this patient face-to-face of which over half was spent in counseling

## 2011-03-26 NOTE — Patient Instructions (Signed)
Patient was instructed to continue all medications as prescribed. To stop at the checkout desk and schedule a followup appointment  

## 2011-04-23 ENCOUNTER — Other Ambulatory Visit: Payer: Self-pay | Admitting: Internal Medicine

## 2011-06-01 ENCOUNTER — Ambulatory Visit (INDEPENDENT_AMBULATORY_CARE_PROVIDER_SITE_OTHER): Payer: BC Managed Care – PPO | Admitting: Internal Medicine

## 2011-06-01 DIAGNOSIS — Z23 Encounter for immunization: Secondary | ICD-10-CM

## 2011-07-31 ENCOUNTER — Ambulatory Visit (INDEPENDENT_AMBULATORY_CARE_PROVIDER_SITE_OTHER): Payer: BC Managed Care – PPO | Admitting: Psychology

## 2011-07-31 DIAGNOSIS — F411 Generalized anxiety disorder: Secondary | ICD-10-CM

## 2011-08-14 ENCOUNTER — Ambulatory Visit (INDEPENDENT_AMBULATORY_CARE_PROVIDER_SITE_OTHER): Payer: BC Managed Care – PPO | Admitting: Psychology

## 2011-08-14 DIAGNOSIS — F411 Generalized anxiety disorder: Secondary | ICD-10-CM

## 2011-08-23 ENCOUNTER — Other Ambulatory Visit: Payer: Self-pay | Admitting: Internal Medicine

## 2011-08-28 ENCOUNTER — Ambulatory Visit (INDEPENDENT_AMBULATORY_CARE_PROVIDER_SITE_OTHER): Payer: BC Managed Care – PPO | Admitting: Psychology

## 2011-08-28 DIAGNOSIS — F411 Generalized anxiety disorder: Secondary | ICD-10-CM

## 2011-09-11 ENCOUNTER — Ambulatory Visit (INDEPENDENT_AMBULATORY_CARE_PROVIDER_SITE_OTHER): Payer: BC Managed Care – PPO | Admitting: Psychology

## 2011-09-11 DIAGNOSIS — F411 Generalized anxiety disorder: Secondary | ICD-10-CM

## 2011-09-28 ENCOUNTER — Ambulatory Visit (INDEPENDENT_AMBULATORY_CARE_PROVIDER_SITE_OTHER): Payer: BC Managed Care – PPO | Admitting: Psychology

## 2011-09-28 DIAGNOSIS — F411 Generalized anxiety disorder: Secondary | ICD-10-CM

## 2011-10-09 ENCOUNTER — Ambulatory Visit (INDEPENDENT_AMBULATORY_CARE_PROVIDER_SITE_OTHER): Payer: BC Managed Care – PPO | Admitting: Psychology

## 2011-10-09 DIAGNOSIS — F411 Generalized anxiety disorder: Secondary | ICD-10-CM

## 2011-10-19 ENCOUNTER — Ambulatory Visit (INDEPENDENT_AMBULATORY_CARE_PROVIDER_SITE_OTHER): Payer: BC Managed Care – PPO | Admitting: Psychology

## 2011-10-19 DIAGNOSIS — F411 Generalized anxiety disorder: Secondary | ICD-10-CM

## 2011-11-10 ENCOUNTER — Ambulatory Visit (INDEPENDENT_AMBULATORY_CARE_PROVIDER_SITE_OTHER): Payer: BC Managed Care – PPO | Admitting: Psychology

## 2011-11-10 DIAGNOSIS — F411 Generalized anxiety disorder: Secondary | ICD-10-CM

## 2011-11-12 ENCOUNTER — Other Ambulatory Visit (INDEPENDENT_AMBULATORY_CARE_PROVIDER_SITE_OTHER): Payer: BC Managed Care – PPO

## 2011-11-12 DIAGNOSIS — Z0000 Encounter for general adult medical examination without abnormal findings: Secondary | ICD-10-CM

## 2011-11-12 LAB — CBC WITH DIFFERENTIAL/PLATELET
Basophils Relative: 0.5 % (ref 0.0–3.0)
Eosinophils Relative: 3.3 % (ref 0.0–5.0)
MCV: 97.1 fl (ref 78.0–100.0)
Monocytes Absolute: 0.4 10*3/uL (ref 0.1–1.0)
Neutrophils Relative %: 54.1 % (ref 43.0–77.0)
RBC: 4.38 Mil/uL (ref 4.22–5.81)
WBC: 5.1 10*3/uL (ref 4.5–10.5)

## 2011-11-12 LAB — BASIC METABOLIC PANEL
Chloride: 102 mEq/L (ref 96–112)
Creatinine, Ser: 1.2 mg/dL (ref 0.4–1.5)
Potassium: 4.2 mEq/L (ref 3.5–5.1)

## 2011-11-12 LAB — POCT URINALYSIS DIPSTICK
Bilirubin, UA: NEGATIVE
Blood, UA: NEGATIVE
Glucose, UA: NEGATIVE
Nitrite, UA: NEGATIVE
Spec Grav, UA: <=1.005

## 2011-11-12 LAB — HEPATIC FUNCTION PANEL
ALT: 17 U/L (ref 0–53)
AST: 22 U/L (ref 0–37)
Alkaline Phosphatase: 36 U/L — ABNORMAL LOW (ref 39–117)
Bilirubin, Direct: 0.1 mg/dL (ref 0.0–0.3)
Total Protein: 6.9 g/dL (ref 6.0–8.3)

## 2011-11-12 LAB — LIPID PANEL
HDL: 50.1 mg/dL (ref >39.00)
LDL Cholesterol: 90 mg/dL (ref 0–99)
Total CHOL/HDL Ratio: 3
Triglycerides: 55 mg/dL (ref 0.0–149.0)

## 2011-11-22 ENCOUNTER — Other Ambulatory Visit: Payer: Self-pay | Admitting: Internal Medicine

## 2011-11-25 ENCOUNTER — Other Ambulatory Visit: Payer: BC Managed Care – PPO

## 2011-11-30 ENCOUNTER — Ambulatory Visit (INDEPENDENT_AMBULATORY_CARE_PROVIDER_SITE_OTHER): Payer: BC Managed Care – PPO | Admitting: Psychology

## 2011-11-30 DIAGNOSIS — F411 Generalized anxiety disorder: Secondary | ICD-10-CM

## 2011-12-02 ENCOUNTER — Ambulatory Visit (INDEPENDENT_AMBULATORY_CARE_PROVIDER_SITE_OTHER): Payer: BC Managed Care – PPO | Admitting: Internal Medicine

## 2011-12-02 ENCOUNTER — Encounter: Payer: Self-pay | Admitting: Internal Medicine

## 2011-12-02 VITALS — BP 120/78 | HR 68 | Temp 98.2°F | Resp 14 | Ht 74.0 in | Wt 190.0 lb

## 2011-12-02 DIAGNOSIS — Z0000 Encounter for general adult medical examination without abnormal findings: Secondary | ICD-10-CM

## 2011-12-02 DIAGNOSIS — E785 Hyperlipidemia, unspecified: Secondary | ICD-10-CM

## 2011-12-02 DIAGNOSIS — D473 Essential (hemorrhagic) thrombocythemia: Secondary | ICD-10-CM

## 2011-12-02 DIAGNOSIS — N411 Chronic prostatitis: Secondary | ICD-10-CM

## 2011-12-02 MED ORDER — CIPROFLOXACIN HCL 500 MG PO TABS: 500.0000 mg | ORAL_TABLET | Freq: Two times a day (BID) | ORAL | Status: AC

## 2011-12-02 NOTE — Progress Notes (Signed)
Subjective:    Patient ID: Keith Benton, male    DOB: [birth year], 41 y.o.   MRN: [id number]  HPI  CPX reviewed the CPX lab results with the patient reviewed thrombocythemia Chronic prostate symptoms   Review of Systems  Constitutional: Negative for fever and fatigue.  HENT: Negative for hearing loss, congestion, neck pain and postnasal drip.   Eyes: Negative for discharge, redness and visual disturbance.  Respiratory: Negative for cough, shortness of breath and wheezing.   Cardiovascular: Negative for leg swelling.  Gastrointestinal: Negative for abdominal pain, constipation and abdominal distention.  Genitourinary: Negative for urgency and frequency.  Musculoskeletal: Negative for joint swelling and arthralgias.  Skin: Negative for color change and rash.  Neurological: Negative for weakness and light-headedness.  Hematological: Negative for adenopathy.  Psychiatric/Behavioral: Negative for behavioral problems.   Past Medical History  Diagnosis Date  . Abdominal pain, generalized 06/10/2007  . Anxiety state, unspecified 06/06/2010  . CHEST WALL PAIN, ANTERIOR 10/11/2009  . CONSTIPATION, CHRONIC 06/10/2007  . CONSTIPATION 11/28/2007  . GASTROESOPHAGEAL REFLUX DISEASE, SEVERE 10/14/2007  . Herpes simplex without mention of complication 06/10/2007  . HYPERLIPIDEMIA, MILD, WITH LOW HDL 06/04/2009  . KNEE PAIN, LEFT, CHRONIC 05/02/2008  . LOC OSTEOARTHROS NOT SPEC PRIM/SEC LOWER LEG 07/15/2007  . THROMBOCYTHEMIA 02/04/2010    History   Social History  . Marital Status: Married    Spouse Name: N/A    Number of Children: N/A  . 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 Smoking status: Never Smoker   . Smokeless tobacco: Not on file  . Alcohol Use: 1.8 oz/week    1 Glasses of wine, 1 Cans of beer, 1 Shots of liquor per week  . Drug Use: No  . Sexually Active: Yes   Other Topics Concern  . Not on file   Social  History Narrative  . No narrative on file    Past Surgical History  Procedure Date  . Fibroid cyst removed from neck   . Anterior cruciate ligament repair 2001    right    Family History  Problem Relation Age of Onset  . Kidney failure    . Kidney disease Mother   . Hypertension Mother   . Depression Mother   . Hyperlipidemia Mother   . Stroke Mother   . Diabetes Mother   . Heart disease Mother   . Irregular heart beat Father     No Known Allergies  Current Outpatient Prescriptions on File Prior to Visit  Medication Sig Dispense Refill  . aspirin 81 MG tablet Take 81 mg by mouth daily.        . calcium citrate-vitamin D 200-200 MG-UNIT TABS Take 1 tablet by mouth daily.        . cetirizine (ZYRTEC) 10 MG tablet Take 10 mg by mouth daily.      . mometasone (NASONEX) 50 MCG/ACT nasal spray Place 2 sprays into the nose daily.      . montelukast (SINGULAIR) 10 MG tablet Take 10 mg by mouth at bedtime.      . pantoprazole (PROTONIX) 20 MG tablet Take 20 mg by mouth every other day.       . valACYclovir (VALTREX) 1000 MG tablet TAKE 1/2 TABLET BY MOUTH EVERY DAY  15 tablet  2    BP 120/78  Pulse 68  Temp 98.2 F (36.8 C)  Resp 14  Ht 6\' 2"  (1.88 m)  Wt  190 lb (86.183 kg)  BMI 24.39 kg/m2        Objective:   Physical Exam  Nursing note and vitals reviewed. Constitutional: He is oriented to person, place, and time. He appears well-developed and well-nourished.  HENT:  Head: Normocephalic and atraumatic.  Eyes: Conjunctivae are normal. Pupils are equal, round, and reactive to light.  Neck: Normal range of motion. Neck supple.  Cardiovascular: Normal rate and regular rhythm.   Pulmonary/Chest: Effort normal and breath sounds normal.  Abdominal: Soft. Bowel sounds are normal.  Genitourinary: Rectum normal.       Inflamed tender right lobe  Musculoskeletal: Normal range of motion.  Neurological: He is alert and oriented to person, place, and time.  Skin: Skin is  warm and dry.  Psychiatric: He has a normal mood and affect. His behavior is normal.          Assessment & Plan:   Patient presents for yearly preventative medicine examination.   all immunizations and health maintenance protocols were reviewed with the patient and they are up to date with these protocols.   screening laboratory values were reviewed with the patient including screening of hyperlipidemia PSA renal function and hepatic function.   There medications past medical history social history problem list and allergies were reviewed in detail.   Goals were established with regard to weight loss exercise diet in compliance with medications  Patient is stable thrombocytopenia we discussed etiologies of this condition.  The patient has stable lipids on kril oil Patient has allergic rhinitis not treated with Nasonex and Singulair.  The patient has chronic gastroesophageal reflux on Protonix.  He is doing well he does have an acute prostatitis discovered on physical examination the symptoms Which will send in ciprofloxacin 500 mg by mouth twice a day for 14 days

## 2011-12-02 NOTE — Patient Instructions (Addendum)
The patient is instructed to continue all medications as prescribed. Schedule followup with check out clerk upon leaving the clinic Soaks with white vinegar 1 cup in 1 gallon of water soaks for about 20 minutes 3 times a week

## 2011-12-14 ENCOUNTER — Ambulatory Visit (INDEPENDENT_AMBULATORY_CARE_PROVIDER_SITE_OTHER): Payer: BC Managed Care – PPO | Admitting: Psychology

## 2011-12-14 DIAGNOSIS — F411 Generalized anxiety disorder: Secondary | ICD-10-CM

## 2011-12-21 ENCOUNTER — Telehealth: Payer: Self-pay | Admitting: Internal Medicine

## 2011-12-21 NOTE — Telephone Encounter (Signed)
Pt lost one of abx cipro. Please advise

## 2011-12-21 NOTE — Telephone Encounter (Signed)
Not to worry.  Left message on machine For pt

## 2012-01-07 ENCOUNTER — Ambulatory Visit (INDEPENDENT_AMBULATORY_CARE_PROVIDER_SITE_OTHER): Payer: BC Managed Care – PPO | Admitting: Psychology

## 2012-01-07 DIAGNOSIS — F411 Generalized anxiety disorder: Secondary | ICD-10-CM

## 2012-01-15 ENCOUNTER — Ambulatory Visit (INDEPENDENT_AMBULATORY_CARE_PROVIDER_SITE_OTHER): Payer: BC Managed Care – PPO | Admitting: Psychology

## 2012-01-15 DIAGNOSIS — F411 Generalized anxiety disorder: Secondary | ICD-10-CM

## 2012-01-25 ENCOUNTER — Ambulatory Visit (INDEPENDENT_AMBULATORY_CARE_PROVIDER_SITE_OTHER): Payer: BC Managed Care – PPO | Admitting: Psychology

## 2012-01-25 DIAGNOSIS — F411 Generalized anxiety disorder: Secondary | ICD-10-CM

## 2012-02-08 ENCOUNTER — Ambulatory Visit (INDEPENDENT_AMBULATORY_CARE_PROVIDER_SITE_OTHER): Payer: BC Managed Care – PPO | Admitting: Psychology

## 2012-02-08 DIAGNOSIS — F411 Generalized anxiety disorder: Secondary | ICD-10-CM

## 2012-02-29 ENCOUNTER — Other Ambulatory Visit: Payer: Self-pay | Admitting: Internal Medicine

## 2012-03-04 ENCOUNTER — Ambulatory Visit (INDEPENDENT_AMBULATORY_CARE_PROVIDER_SITE_OTHER): Payer: BC Managed Care – PPO | Admitting: Family

## 2012-03-04 ENCOUNTER — Encounter: Payer: Self-pay | Admitting: Family

## 2012-03-04 VITALS — BP 108/70 | HR 97 | Wt 180.0 lb

## 2012-03-04 DIAGNOSIS — L02519 Cutaneous abscess of unspecified hand: Secondary | ICD-10-CM

## 2012-03-04 DIAGNOSIS — L739 Follicular disorder, unspecified: Secondary | ICD-10-CM

## 2012-03-04 DIAGNOSIS — L738 Other specified follicular disorders: Secondary | ICD-10-CM

## 2012-03-04 DIAGNOSIS — L678 Other hair color and hair shaft abnormalities: Secondary | ICD-10-CM

## 2012-03-04 DIAGNOSIS — L02512 Cutaneous abscess of left hand: Secondary | ICD-10-CM

## 2012-03-04 MED ORDER — DOXYCYCLINE HYCLATE 100 MG PO TABS: 100.0000 mg | ORAL_TABLET | Freq: Two times a day (BID) | ORAL | Status: DC

## 2012-03-04 NOTE — Progress Notes (Signed)
Subjective:    Patient ID: Keith Benton, male    DOB: Jul 07, 1970, 41 y.o.   MRN: [id number]  HPI 41 year old white male, nonsmoker, patient of Dr. Lovell Sheehan is in with complaints of an infected left thumb. Patient reports having a pustule to the left thumb that he has since lanced 2 days ago. His thumb feels better. Continues to be red and mildly swollen. Patient also has concerns of red bumps to his arms and legs that are sore, stay for 2 days then resolves. Reports his mother-in-law had been a staph infection. His wife has similar symptoms as well. Mother a lot was treated with doxycycline and improved.   Review of Systems  Constitutional: Negative.   Respiratory: Negative.   Cardiovascular: Negative.   Skin: Positive for rash.       Red bumps to the arms and legs bilaterally. Tenderness in the left thumb.  Psychiatric/Behavioral: Negative.    Past Medical History  Diagnosis Date  . Abdominal pain, generalized 06/10/2007  . Anxiety state, unspecified 06/06/2010  . CHEST WALL PAIN, ANTERIOR 10/11/2009  . CONSTIPATION, CHRONIC 06/10/2007  . CONSTIPATION 11/28/2007  . GASTROESOPHAGEAL REFLUX DISEASE, SEVERE 10/14/2007  . Herpes simplex without mention of complication 06/10/2007  . HYPERLIPIDEMIA, MILD, WITH LOW HDL 06/04/2009  . KNEE PAIN, LEFT, CHRONIC 05/02/2008  . LOC OSTEOARTHROS NOT SPEC PRIM/SEC LOWER LEG 07/15/2007  . THROMBOCYTHEMIA 02/04/2010    History   Social History  . Marital Status: Married    Spouse Name: N/A    Number of Children: N/A  . 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 Smoking status: Never Smoker   . Smokeless tobacco: Not on file  . Alcohol Use: 1.8 oz/week    1 Glasses of wine, 1 Cans of beer, 1 Shots of liquor per week  . Drug Use: No  . Sexually Active: Yes   Other Topics Concern  . Not on file   Social History Narrative  . No narrative on file    Past Surgical History  Procedure Date   . Fibroid cyst removed from neck   . Anterior cruciate ligament repair 2001    right    Family History  Problem Relation Age of Onset  . Kidney failure    . Kidney disease Mother   . Hypertension Mother   . Depression Mother   . Hyperlipidemia Mother   . Stroke Mother   . Diabetes Mother   . Heart disease Mother   . Irregular heart beat Father     No Known Allergies  Current Outpatient Prescriptions on File Prior to Visit  Medication Sig Dispense Refill  . aspirin 81 MG tablet Take 81 mg by mouth daily.        . calcium citrate-vitamin D 200-200 MG-UNIT TABS Take 1 tablet by mouth daily.        . cetirizine (ZYRTEC) 10 MG tablet Take 10 mg by mouth daily.      . mometasone (NASONEX) 50 MCG/ACT nasal spray Place 2 sprays into the nose daily.      . montelukast (SINGULAIR) 10 MG tablet Take 10 mg by mouth at bedtime.      . valACYclovir (VALTREX) 1000 MG tablet TAKE 1/2 TABLET BY MOUTH EVERY DAY  15 tablet  0  . pantoprazole (PROTONIX) 20 MG tablet Take 20 mg by mouth every other day.         BP 108/70  Pulse  97  Wt 180 lb (81.647 kg)  SpO2 98%chart    Objective:   Physical Exam  Constitutional: He is oriented to person, place, and time. He appears well-developed and well-nourished.  Cardiovascular: Normal rate, regular rhythm and normal heart sounds.   Pulmonary/Chest: Effort normal and breath sounds normal.  Neurological: He is alert and oriented to person, place, and time.  Skin: Skin is warm and dry. Rash noted. There is erythema.       Left thumb: Oriented, warm, mildly tender to touch. Appears to be improving. Folliculitis noted to the arms and legs bilaterally, mild and diffuse, no drainage or discharge.  Psychiatric: He has a normal mood and affect.          Assessment & Plan:  Assessment: Folliculitis, Abscess left thumb-healing  Plan: Doxycycline 100 mg twice a day x10 days. Patient call the office symptoms worsen or persist. Recheck a scheduled and as  needed sooner.

## 2012-03-04 NOTE — Patient Instructions (Addendum)
Folliculitis  °Folliculitis is an infection and inflammation of the hair follicles. Hair follicles become red and irritated. This inflammation is usually caused by bacteria. The bacteria thrive in warm, moist environments. This condition can be seen anywhere on the body.  °CAUSES °The most common cause of folliculitis is an infection by germs (bacteria). Fungal and viral infections can also cause the condition. Viral infections may be more common in people whose bodies are unable to fight disease well (weakened immune systems). Examples include people with: °· AIDS.  °· An organ transplant.  °· Cancer.  °People with depressed immune systems, diabetes, or obesity, have a greater risk of getting folliculitis than the general population. Certain chemicals, especially oils and tars, also can cause folliculitis. °SYMPTOMS °· An early sign of folliculitis is a small, white or yellow pus-filled, itchy lesion (pustule). These lesions appear on a red, inflamed follicle. They are usually less than 5 mm (.20 inches).  °· The most likely starting points are the scalp, thighs, legs, back and buttocks. Folliculitis is also frequently found in areas of repeated shaving.  °· When an infection of the follicle goes deeper, it becomes a boil or furuncle. A group of closely packed boils create a larger lesion (a carbuncle). These sores (lesions) tend to occur in hairy, sweaty areas of the body.  °TREATMENT  °· A doctor who specializes in skin problems (dermatologists) treats mild cases of folliculitis with antiseptic washes.  °· They also use a skin application which kills germs (topical antibiotics). Tea tree oil is a good topical antiseptic as well. It can be found at a health food store. A small percentage of individuals may develop an allergy to the tea tree oil.  °· Mild to moderate boils respond well to warm water compresses applied three times daily.  °· In some cases, oral antibiotics should be taken with the skin treatment.    °· If lesions contain large quantities of pus or fluid, your caregiver may drain them. This allows the topical antibiotics to get to the affected areas better.  °· Stubborn cases of folliculitis may respond to laser hair removal. This process uses a high intensity light beam (a laser) to destroy the follicle and reduces the scarring from folliculitis. After laser hair removal, hair will no longer grow in the laser treated area.  °Patients with long-lasting folliculitis need to find out where the infection is coming from. Germs can live in the nostrils of the patient. This can trigger an outbreak now and then. Sometimes the bacteria live in the nostrils of a family member. This person does not develop the disorder but they repeatedly re-expose others to the germ. To break the cycle of recurrence in the patient, the family member must also undergo treatment. °PREVENTION  °· Individuals who are predisposed to folliculitis should be extremely careful about personal hygiene.  °· Application of antiseptic washes may help prevent recurrences.  °· A topical antibiotic cream, mupirocin (Bactroban®), has been effective at reducing bacteria in the nostrils. It is applied inside the nose with your little finger. This is done twice daily for a week. Then it is repeated every 6 months.  °· Because follicle disorders tend to come back, patients must receive follow-up care. Your caregiver may be able to recognize a recurrence before it becomes severe.  °SEEK IMMEDIATE MEDICAL CARE IF:  °· You develop redness, swelling, or increasing pain in the area.  °· You have a fever.  °· You are not improving with treatment   or are getting worse.  °· You have any other questions or concerns.  °Document Released: 08/24/2001 Document Revised: 06/04/2011 Document Reviewed: 06/20/2008 °[employer]® Patient Information ©2012 [employer]. °

## 2012-03-07 ENCOUNTER — Ambulatory Visit (INDEPENDENT_AMBULATORY_CARE_PROVIDER_SITE_OTHER): Payer: BC Managed Care – PPO | Admitting: Psychology

## 2012-03-07 DIAGNOSIS — F411 Generalized anxiety disorder: Secondary | ICD-10-CM

## 2012-03-11 ENCOUNTER — Encounter: Payer: Self-pay | Admitting: Internal Medicine

## 2012-03-11 ENCOUNTER — Ambulatory Visit (INDEPENDENT_AMBULATORY_CARE_PROVIDER_SITE_OTHER): Payer: BC Managed Care – PPO | Admitting: Internal Medicine

## 2012-03-11 VITALS — BP 110/70 | HR 64 | Temp 98.3°F | Resp 16 | Ht 74.0 in | Wt 178.0 lb

## 2012-03-11 DIAGNOSIS — E785 Hyperlipidemia, unspecified: Secondary | ICD-10-CM

## 2012-03-11 DIAGNOSIS — N400 Enlarged prostate without lower urinary tract symptoms: Secondary | ICD-10-CM

## 2012-03-11 DIAGNOSIS — Z23 Encounter for immunization: Secondary | ICD-10-CM

## 2012-03-11 DIAGNOSIS — D473 Essential (hemorrhagic) thrombocythemia: Secondary | ICD-10-CM

## 2012-03-11 MED ORDER — FINASTERIDE 5 MG PO TABS: 5.0000 mg | ORAL_TABLET | Freq: Every day | ORAL | Status: AC

## 2012-03-11 NOTE — Patient Instructions (Signed)
Come back anytime over the next one to 2 months for a lipid profile

## 2012-03-11 NOTE — Progress Notes (Signed)
Subjective:    Patient ID: Keith Benton, male    DOB: [birth year], 41 y.o.   MRN: [id number]  HPI  protate infection resolved but having some  Hesitancy with end of urination Discussed symptoms of benign prostatic hypertrophy and opportunities for treating prostatodynia and chronic prostatitis other than antibiotics. Reviewed patient's chronic problem list including his history of thrombocythemia and hyperlipidemia discussed with patient his wife's status including if she currently using birth control and to do they have any potential risk for pregnancy   Review of Systems  Constitutional: Negative for fever and fatigue.  HENT: Negative for hearing loss, congestion, neck pain and postnasal drip.   Eyes: Negative for discharge, redness and visual disturbance.  Respiratory: Negative for cough, shortness of breath and wheezing.   Cardiovascular: Negative for leg swelling.  Gastrointestinal: Negative for abdominal pain, constipation and abdominal distention.  Genitourinary: Negative for urgency and frequency.  Musculoskeletal: Negative for joint swelling and arthralgias.  Skin: Negative for color change and rash.  Neurological: Negative for weakness and light-headedness.  Hematological: Negative for adenopathy.  Psychiatric/Behavioral: Negative for behavioral problems.   Past Medical History  Diagnosis Date  . Abdominal pain, generalized 06/10/2007  . Anxiety state, unspecified 06/06/2010  . CHEST WALL PAIN, ANTERIOR 10/11/2009  . CONSTIPATION, CHRONIC 06/10/2007  . CONSTIPATION 11/28/2007  . GASTROESOPHAGEAL REFLUX DISEASE, SEVERE 10/14/2007  . Herpes simplex without mention of complication 06/10/2007  . HYPERLIPIDEMIA, MILD, WITH LOW HDL 06/04/2009  . KNEE PAIN, LEFT, CHRONIC 05/02/2008  . LOC OSTEOARTHROS NOT SPEC PRIM/SEC LOWER LEG 07/15/2007  . THROMBOCYTHEMIA 02/04/2010    History   Social History  . Marital Status: Married    Spouse Name: N/A    Number of Children: N/A  . Years  of Education: N/A   Occupational History  . education [employer]   Social History Main Topics  . Smoking status: Never Smoker   . Smokeless tobacco: Not on file  . Alcohol Use: 1.8 oz/week    1 Glasses of wine, 1 Cans of beer, 1 Shots of liquor per week  . Drug Use: No  . Sexually Active: Yes   Other Topics Concern  . Not on file   Social History Narrative  . No narrative on file    Past Surgical History  Procedure Date  . Fibroid cyst removed from neck   . Anterior cruciate ligament repair 2001    right    Family History  Problem Relation Age of Onset  . Kidney failure    . Kidney disease Mother   . Hypertension Mother   . Depression Mother   . Hyperlipidemia Mother   . Stroke Mother   . Diabetes Mother   . Heart disease Mother   . Irregular heart beat Father     No Known Allergies  Current Outpatient Prescriptions on File Prior to Visit  Medication Sig Dispense Refill  . aspirin 81 MG tablet Take 81 mg by mouth daily.        . calcium citrate-vitamin D 200-200 MG-UNIT TABS Take 1 tablet by mouth daily.        . cetirizine (ZYRTEC) 10 MG tablet Take 10 mg by mouth daily.      . mometasone (NASONEX) 50 MCG/ACT nasal spray Place 2 sprays into the nose daily.      . montelukast (SINGULAIR) 10 MG tablet Take 10 mg by mouth at bedtime.      . pantoprazole (PROTONIX) 20 MG tablet Take 20 mg by mouth every other  day.       . valACYclovir (VALTREX) 1000 MG tablet TAKE 1/2 TABLET BY MOUTH EVERY DAY  15 tablet  0    BP 110/70  Pulse 64  Temp 98.3 F (36.8 C)  Resp 16  Ht 6\' 2"  (1.88 m)  Wt 178 lb (80.74 kg)  BMI 22.85 kg/m2       Objective:   Physical Exam  Nursing note and vitals reviewed. Constitutional: He appears well-developed and well-nourished.  HENT:  Head: Normocephalic and atraumatic.  Eyes: Conjunctivae normal are normal. Pupils are equal, round, and reactive to light.  Neck: Normal range of motion. Neck supple.  Cardiovascular: Normal rate and  regular rhythm.   Pulmonary/Chest: Effort normal and breath sounds normal.  Abdominal: Soft. Bowel sounds are normal.          Assessment & Plan:  Discussed carefully the risks of using Proscar for reduction of the prostate for any procreation. Patient assures that his wife is on birth control and he will discuss risks with his wife.  Patient will begin Proscar 5 mg by mouth daily to see if twisted the knee and enlarged prostate will respond and will followup in 3 months time at which time we will monitor his history of thrombocythemia as well as assess further any risks.  I have spent more than 30 minutes examining this patient face-to-face of which over half was spent in counseling about the use of Proscar

## 2012-03-18 ENCOUNTER — Ambulatory Visit (INDEPENDENT_AMBULATORY_CARE_PROVIDER_SITE_OTHER): Payer: BC Managed Care – PPO | Admitting: Psychology

## 2012-03-18 DIAGNOSIS — F411 Generalized anxiety disorder: Secondary | ICD-10-CM

## 2012-04-01 ENCOUNTER — Ambulatory Visit (INDEPENDENT_AMBULATORY_CARE_PROVIDER_SITE_OTHER): Payer: BC Managed Care – PPO | Admitting: Psychology

## 2012-04-01 DIAGNOSIS — F411 Generalized anxiety disorder: Secondary | ICD-10-CM

## 2012-04-18 ENCOUNTER — Ambulatory Visit (INDEPENDENT_AMBULATORY_CARE_PROVIDER_SITE_OTHER): Payer: BC Managed Care – PPO | Admitting: Psychology

## 2012-04-18 DIAGNOSIS — F411 Generalized anxiety disorder: Secondary | ICD-10-CM

## 2012-04-29 ENCOUNTER — Ambulatory Visit (INDEPENDENT_AMBULATORY_CARE_PROVIDER_SITE_OTHER): Payer: BC Managed Care – PPO | Admitting: Psychology

## 2012-04-29 DIAGNOSIS — F411 Generalized anxiety disorder: Secondary | ICD-10-CM

## 2012-05-16 ENCOUNTER — Ambulatory Visit (INDEPENDENT_AMBULATORY_CARE_PROVIDER_SITE_OTHER): Payer: BC Managed Care – PPO | Admitting: Psychology

## 2012-05-16 DIAGNOSIS — F411 Generalized anxiety disorder: Secondary | ICD-10-CM

## 2012-06-01 ENCOUNTER — Other Ambulatory Visit: Payer: BC Managed Care – PPO

## 2012-06-01 ENCOUNTER — Ambulatory Visit (INDEPENDENT_AMBULATORY_CARE_PROVIDER_SITE_OTHER): Payer: BC Managed Care – PPO | Admitting: Psychology

## 2012-06-01 DIAGNOSIS — F411 Generalized anxiety disorder: Secondary | ICD-10-CM

## 2012-06-08 ENCOUNTER — Ambulatory Visit: Payer: BC Managed Care – PPO | Admitting: Internal Medicine

## 2012-06-15 ENCOUNTER — Ambulatory Visit (INDEPENDENT_AMBULATORY_CARE_PROVIDER_SITE_OTHER): Payer: BC Managed Care – PPO | Admitting: Psychology

## 2012-06-15 DIAGNOSIS — F411 Generalized anxiety disorder: Secondary | ICD-10-CM

## 2012-06-30 ENCOUNTER — Ambulatory Visit (INDEPENDENT_AMBULATORY_CARE_PROVIDER_SITE_OTHER): Payer: BC Managed Care – PPO | Admitting: Psychology

## 2012-06-30 DIAGNOSIS — F411 Generalized anxiety disorder: Secondary | ICD-10-CM

## 2012-07-11 ENCOUNTER — Ambulatory Visit (INDEPENDENT_AMBULATORY_CARE_PROVIDER_SITE_OTHER): Payer: BC Managed Care – PPO | Admitting: Psychology

## 2012-07-11 DIAGNOSIS — F411 Generalized anxiety disorder: Secondary | ICD-10-CM

## 2012-07-18 ENCOUNTER — Ambulatory Visit (INDEPENDENT_AMBULATORY_CARE_PROVIDER_SITE_OTHER): Payer: BC Managed Care – PPO | Admitting: Psychology

## 2012-07-18 DIAGNOSIS — F411 Generalized anxiety disorder: Secondary | ICD-10-CM

## 2012-07-19 IMAGING — US US SOFT TISSUE HEAD/NECK
1 series · 13 of 13 positions shown · non-contrast
Comparison: None

CLINICAL DATA: ULTRASOUND OF HEAD/NECK SOFT TISSUES
TECHNIQUE: Ultrasound examination of the head and neck soft
tissues was performed in the area of clinical concern.

[Series 1: us soft tissue head/neck · 0.08mm/px · 13 of 13 slices shown]
[im 1/13]
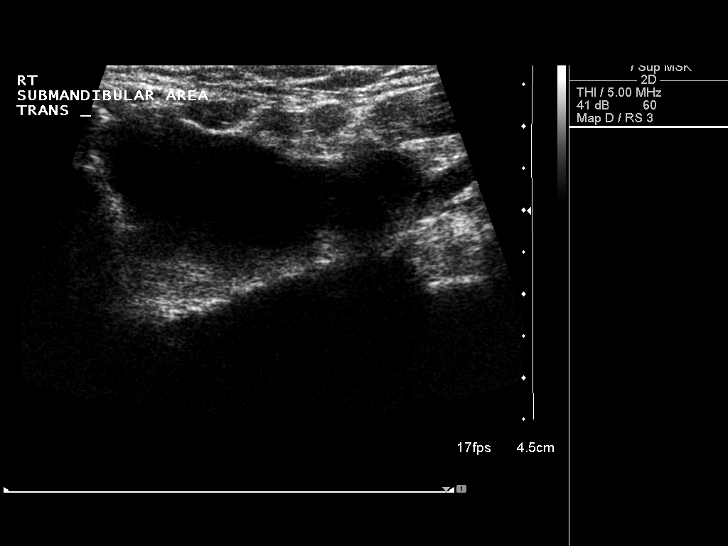
[im 2/13]
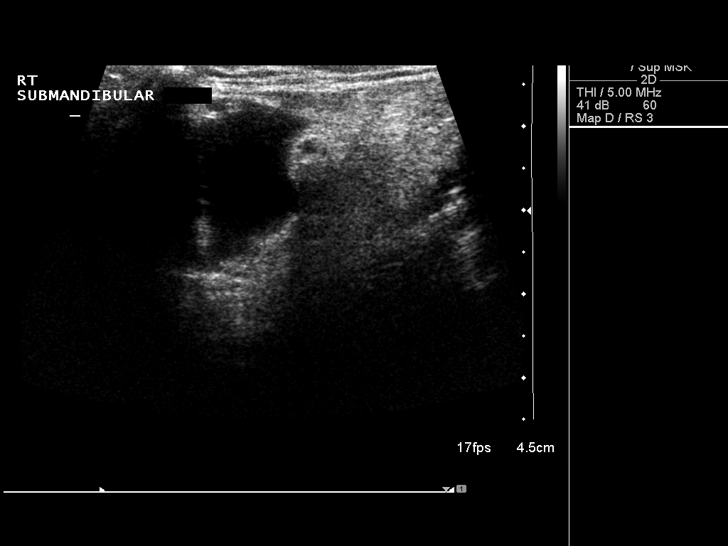
[im 3/13]
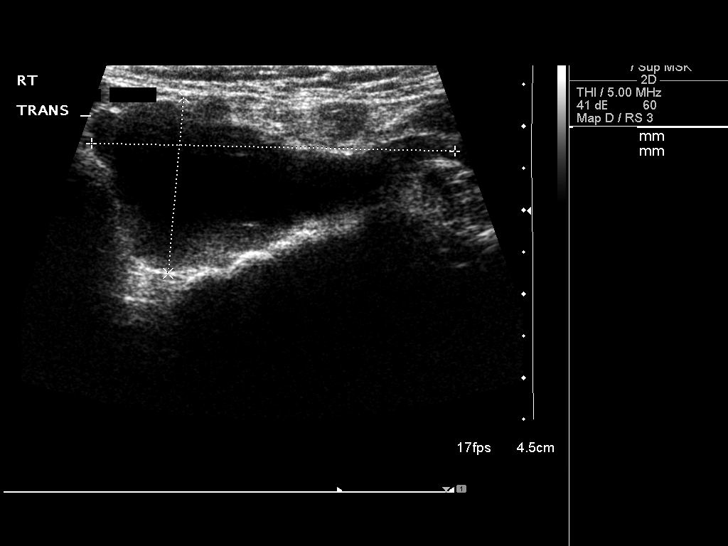
[im 4/13]
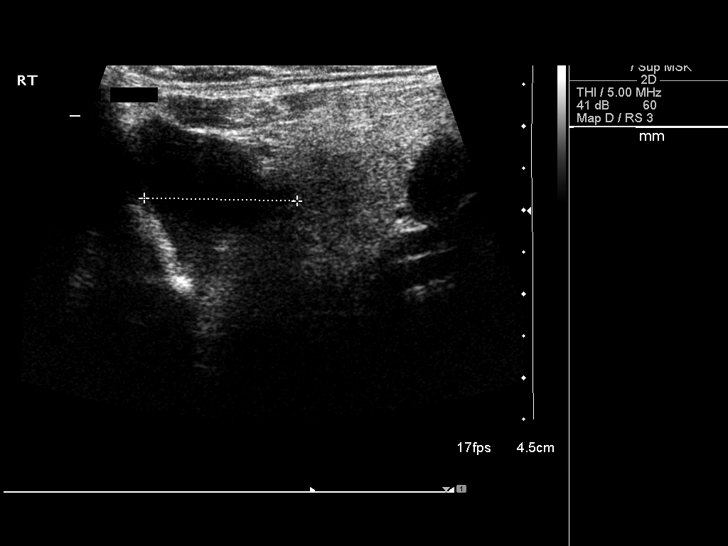
[im 5/13]
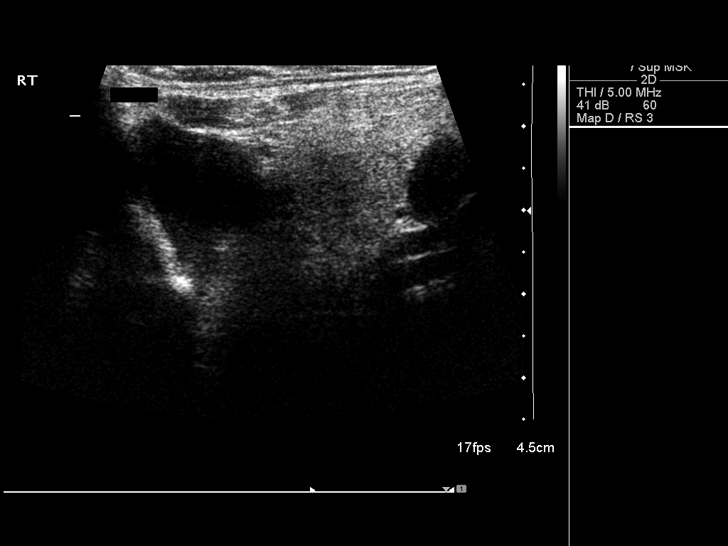
[im 6/13]
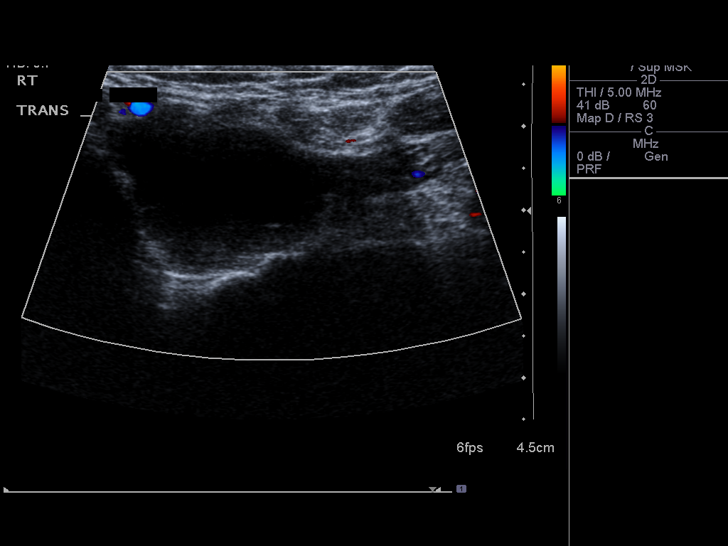
[im 7/13]
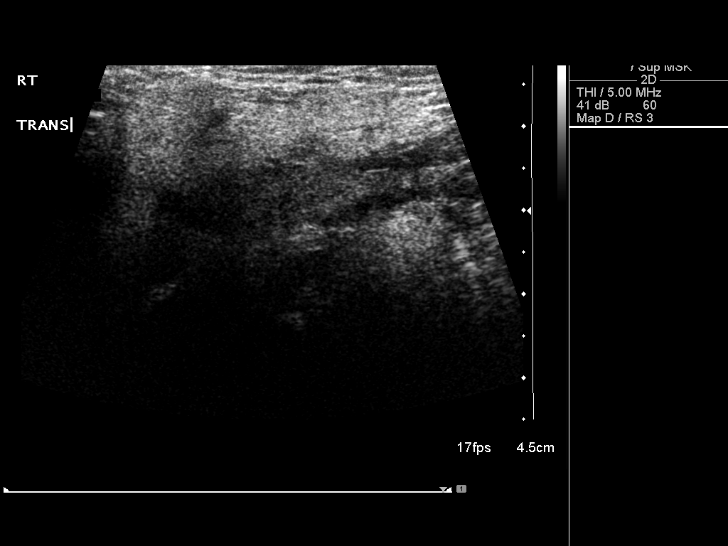
[im 8/13]
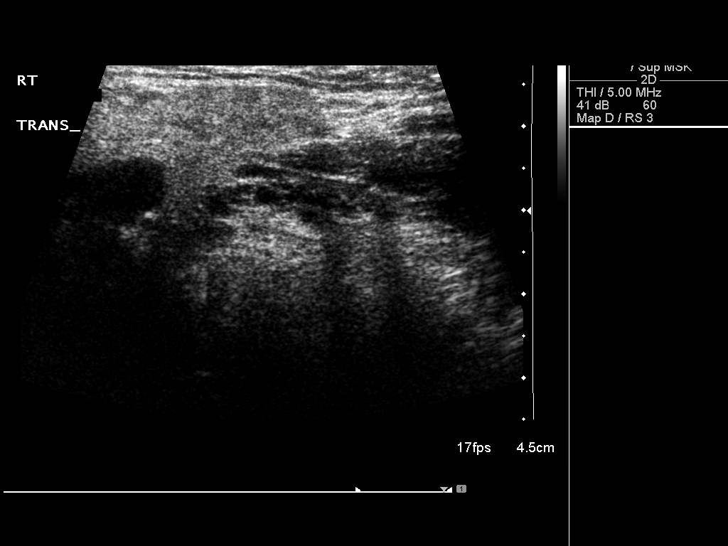
[im 9/13]
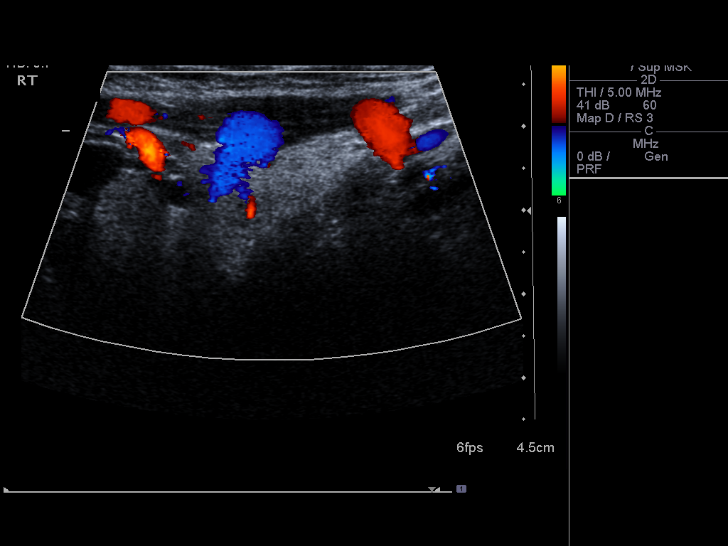
[im 10/13]
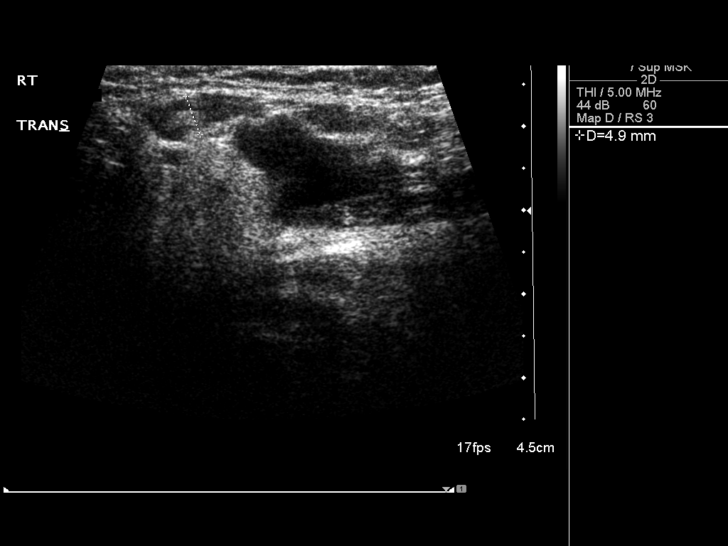
[im 11/13]
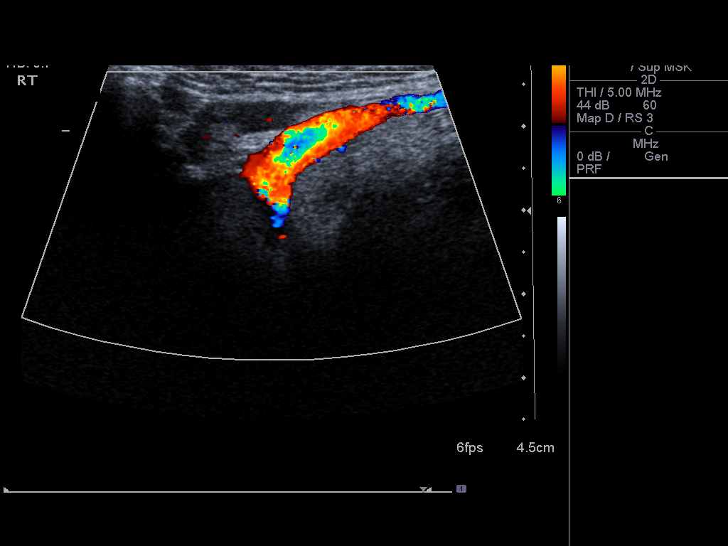
[im 12/13]
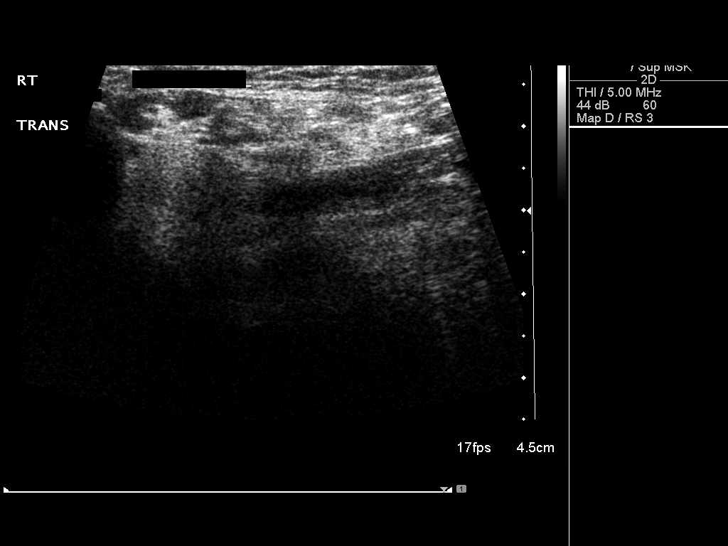
[im 13/13]
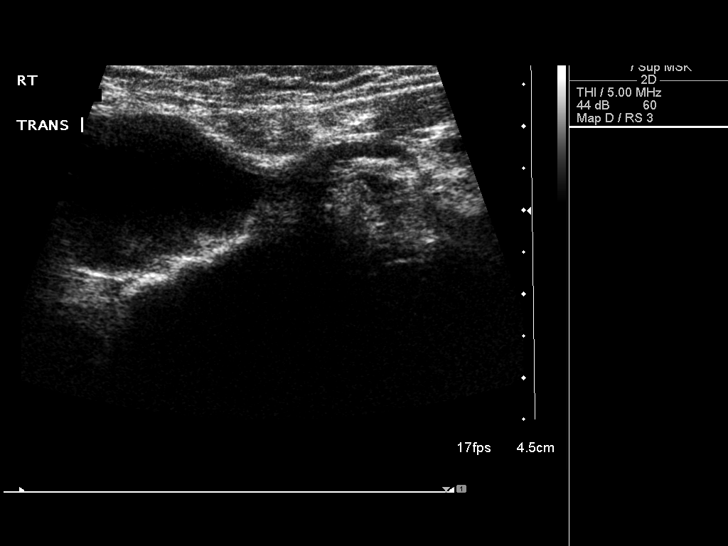

[13 of 13 positions shown; findings below may reference images not displayed]

FINDINGS: At the site of soft tissue prominence in the right
submandibular region there is a poorly defined hypoechoic structure
with some through transmission and a few internal echoes.  This
fluid collection lies adjacent to the right submandibular gland.
This collection measures 4.3 x 2.1 x 1.8 cm.

According to the patient, he had surgery for a cystic lesion at
this site many years ago.  Therefore, this could represent a
recurrent brachial cleft cyst.  However necrotic adenopathy would
be difficult to exclude and further assessment with MRI of the neck
would be recommended.
IMPRESSION: Slightly complex fluid collection corresponding to the palpable
abnormality in the right submandibular region.  In view of the
patient's history this probably represents a branchial cleft cyst.
However, necrotic nodes cannot be excluded and MRI of the neck is
recommended.

## 2012-07-24 ENCOUNTER — Other Ambulatory Visit: Payer: Self-pay | Admitting: Internal Medicine

## 2012-07-25 ENCOUNTER — Ambulatory Visit (INDEPENDENT_AMBULATORY_CARE_PROVIDER_SITE_OTHER): Payer: BC Managed Care – PPO | Admitting: Psychology

## 2012-07-25 DIAGNOSIS — F411 Generalized anxiety disorder: Secondary | ICD-10-CM

## 2012-08-03 ENCOUNTER — Ambulatory Visit (INDEPENDENT_AMBULATORY_CARE_PROVIDER_SITE_OTHER): Payer: BC Managed Care – PPO | Admitting: Psychology

## 2012-08-03 DIAGNOSIS — F411 Generalized anxiety disorder: Secondary | ICD-10-CM

## 2012-08-17 ENCOUNTER — Ambulatory Visit (INDEPENDENT_AMBULATORY_CARE_PROVIDER_SITE_OTHER): Payer: BC Managed Care – PPO | Admitting: Psychology

## 2012-08-17 DIAGNOSIS — F411 Generalized anxiety disorder: Secondary | ICD-10-CM

## 2012-08-31 ENCOUNTER — Ambulatory Visit (INDEPENDENT_AMBULATORY_CARE_PROVIDER_SITE_OTHER): Payer: BC Managed Care – PPO | Admitting: Psychology

## 2012-08-31 DIAGNOSIS — F411 Generalized anxiety disorder: Secondary | ICD-10-CM

## 2012-09-12 ENCOUNTER — Ambulatory Visit (INDEPENDENT_AMBULATORY_CARE_PROVIDER_SITE_OTHER): Payer: BC Managed Care – PPO | Admitting: Family Medicine

## 2012-09-12 ENCOUNTER — Encounter: Payer: Self-pay | Admitting: Family Medicine

## 2012-09-12 VITALS — BP 90/70 | Temp 97.3°F | Wt 174.0 lb

## 2012-09-12 DIAGNOSIS — M67919 Unspecified disorder of synovium and tendon, unspecified shoulder: Secondary | ICD-10-CM

## 2012-09-12 DIAGNOSIS — M7582 Other shoulder lesions, left shoulder: Secondary | ICD-10-CM

## 2012-09-12 NOTE — Progress Notes (Signed)
  Subjective:    Patient ID: Keith Benton, male    DOB: 04-15-1971, 42 y.o.   MRN: [id number]  HPI Patient seen with left shoulder pain. Duration approximately four and one half months. No injury. First noticed after using a leaf blower for several hours Pain is sometimes sharp and worse with abduction. Occasionally with rolling over in bed. Denies any weakness. No upper extremity numbness. No radiculopathy symptoms. Denies any neck pain.  Has not tried any medications. No alleviating factors. Symptoms are moderate but persistent  Past Medical History  Diagnosis Date  . Abdominal pain, generalized 06/10/2007  . Anxiety state, unspecified 06/06/2010  . CHEST WALL PAIN, ANTERIOR 10/11/2009  . CONSTIPATION, CHRONIC 06/10/2007  . CONSTIPATION 11/28/2007  . GASTROESOPHAGEAL REFLUX DISEASE, SEVERE 10/14/2007  . Herpes simplex without mention of complication 06/10/2007  . HYPERLIPIDEMIA, MILD, WITH LOW HDL 06/04/2009  . KNEE PAIN, LEFT, CHRONIC 05/02/2008  . LOC OSTEOARTHROS NOT SPEC PRIM/SEC LOWER LEG 07/15/2007  . THROMBOCYTHEMIA 02/04/2010   Past Surgical History  Procedure Laterality Date  . Fibroid cyst removed from neck    . Anterior cruciate ligament repair  2001    right    reports that he has never smoked. He does not have any smokeless tobacco history on file. He reports that he drinks about 1.8 ounces of alcohol per week. He reports that he does not use illicit drugs. family history includes Depression in his mother; Diabetes in his mother; Heart disease in his mother; Hyperlipidemia in his mother; Hypertension in his mother; Irregular heart beat in his father; Kidney disease in his mother; Kidney failure in an unspecified family member; and Stroke in his mother. No Known Allergies    Review of Systems  Constitutional: Negative for appetite change and unexpected weight change.  HENT: Negative for neck pain.   Neurological: Negative for weakness and numbness.  Hematological:  Negative for adenopathy.       Objective:   Physical Exam  Constitutional: He appears well-developed and well-nourished. No distress.  Neck: Neck supple.  Cardiovascular: Normal rate and regular rhythm.   Musculoskeletal: He exhibits no edema.  Left shoulder reveals no visible edema, erythema, or ecchymosis. No localized tenderness. Pain with abduction greater than about 100-especially with resistance. No pain with internal rotation. Minimal pain with external rotation. No biceps tenderness. No a.c. joint tenderness.          Assessment & Plan:  Persistent left shoulder pain. Suspect rotator cuff tendinitis. No weakness to suggest rotator cuff tear. No evidence for adhesive capsulitis.  Discussed risks and benefits of corticosteroid injection and patient consented.  After prepping skin with betadine, injected 40 mg depomedrol and 2 cc of plain xylocaine with 23 gauge one inch needle using posterior lateral approach and pt tolerated well. Touch base in 1-2 weeks if no better.

## 2012-09-12 NOTE — Patient Instructions (Addendum)
Rotator Cuff Tendonitis  The rotator cuff is the collection of all the muscles and tendons (the supraspinatus, infraspinatus, subscapularis, and teres minor muscles and their tendons) that help your shoulder stay in place. This unit holds the head of the upper arm bone (humerus) in the cup (fossa) of the shoulder blade (scapula). Basically, it connects the arm to the shoulder. Tendinitis is a swelling and irritation of the tissue, called cord like structures (tendons) that connect muscle to bone. It usually is caused by overusing the joint involved. When the tissue surrounding a tendon (the synovium) becomes inflamed, it is called tenosynovitis. This also is often the result of overuse in people whose jobs require repetitive (over and over again) types of motion. HOME CARE INSTRUCTIONS   Use a sling or splint for as long as directed by your caregiver until the pain decreases.  Apply ice to the injury for 15 to 20 minutes, 3 to 4 times per day. Put the ice in a plastic bag and place a towel between the bag of ice and your skin.  Try to avoid use other than gentle range of motion while your shoulder is painful. Use and exercise only as directed by your caregiver. Stop exercises or range of motion if pain or discomfort increases, unless directed otherwise by your caregiver.  Only take over-the-counter or prescription medicines for pain, discomfort, or fever as directed by your caregiver.  If you were give a shoulder sling and straps (immobilizer), do not remove it except as directed, or until you see a caregiver for a follow-up examination. If you need to remove it, move your arm as little as possible or as directed.  You may want to sleep on several pillows at night to lessen swelling and pain. SEEK IMMEDIATE MEDICAL CARE IF:   Pain in your shoulder increases or new pain develops in your arm, hand, or fingers and is not relieved with medications.  You develop new, unexplained symptoms, especially  increased numbness in the hands or loss of strength, or you develop any worsening of the problems which brought you in for care.  Your arm, hand, or fingers are numb or tingling.  Your arm, hand, or fingers are swollen, painful, or turn white or blue. Document Released: 09/05/2003 Document Revised: 09/07/2011 Document Reviewed: 04/12/2008 [facility] Patient Information 2013 [place], [place].  Touch base in 1-2 weeks if shoulder not improved.

## 2012-09-14 ENCOUNTER — Ambulatory Visit (INDEPENDENT_AMBULATORY_CARE_PROVIDER_SITE_OTHER): Payer: BC Managed Care – PPO | Admitting: Psychology

## 2012-09-14 DIAGNOSIS — F411 Generalized anxiety disorder: Secondary | ICD-10-CM

## 2012-09-16 ENCOUNTER — Ambulatory Visit: Payer: BC Managed Care – PPO | Admitting: Internal Medicine

## 2012-09-26 ENCOUNTER — Other Ambulatory Visit: Payer: Self-pay | Admitting: Internal Medicine

## 2012-09-28 ENCOUNTER — Ambulatory Visit (INDEPENDENT_AMBULATORY_CARE_PROVIDER_SITE_OTHER): Payer: BC Managed Care – PPO | Admitting: Psychology

## 2012-09-28 DIAGNOSIS — F411 Generalized anxiety disorder: Secondary | ICD-10-CM

## 2012-10-07 ENCOUNTER — Ambulatory Visit (INDEPENDENT_AMBULATORY_CARE_PROVIDER_SITE_OTHER): Payer: BC Managed Care – PPO | Admitting: Psychology

## 2012-10-07 DIAGNOSIS — F411 Generalized anxiety disorder: Secondary | ICD-10-CM

## 2012-10-12 ENCOUNTER — Ambulatory Visit (INDEPENDENT_AMBULATORY_CARE_PROVIDER_SITE_OTHER): Payer: BC Managed Care – PPO | Admitting: Psychology

## 2012-10-12 DIAGNOSIS — F411 Generalized anxiety disorder: Secondary | ICD-10-CM

## 2012-10-24 IMAGING — US US SOFT TISSUE HEAD/NECK
1 series · 5 of 5 positions shown · non-contrast
Comparison: Ultrasound 11/27/2010.

CLINICAL DATA: Follow-up cystic lesion.

ULTRASOUND OF HEAD/NECK SOFT TISSUES
TECHNIQUE: Ultrasound examination of the head and neck soft
tissues was performed in the area of clinical concern.

[Series 1: us soft tissue head/neck · 0.06mm/px · 5 of 5 slices shown]
[im 1/5]
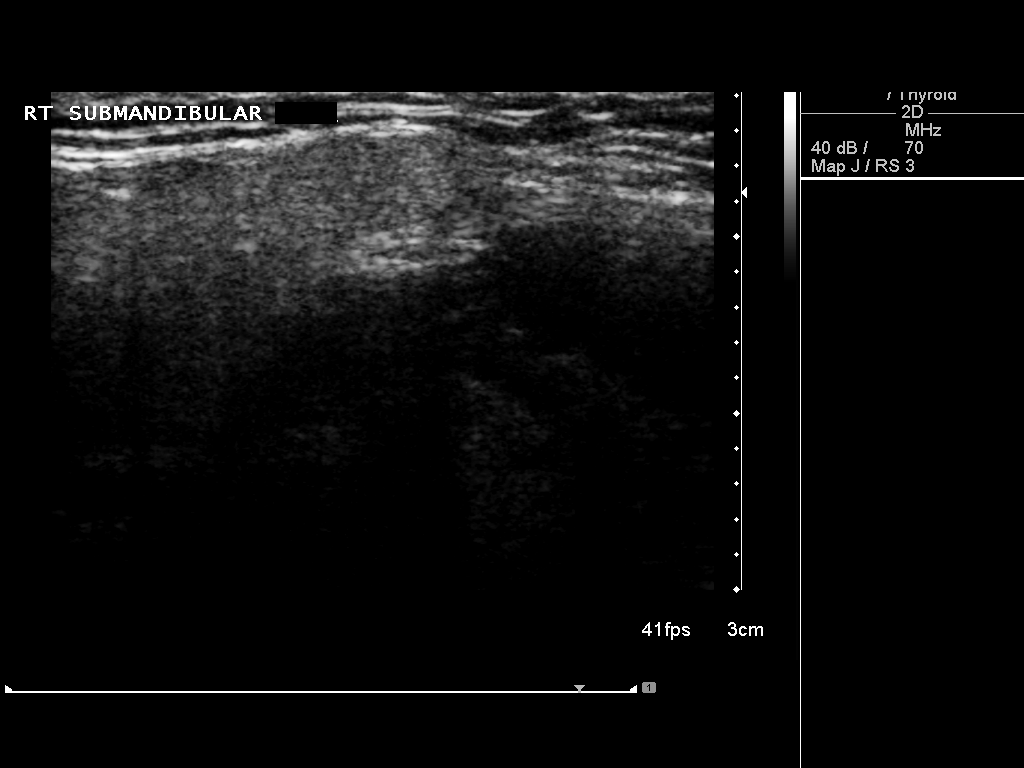
[im 2/5]
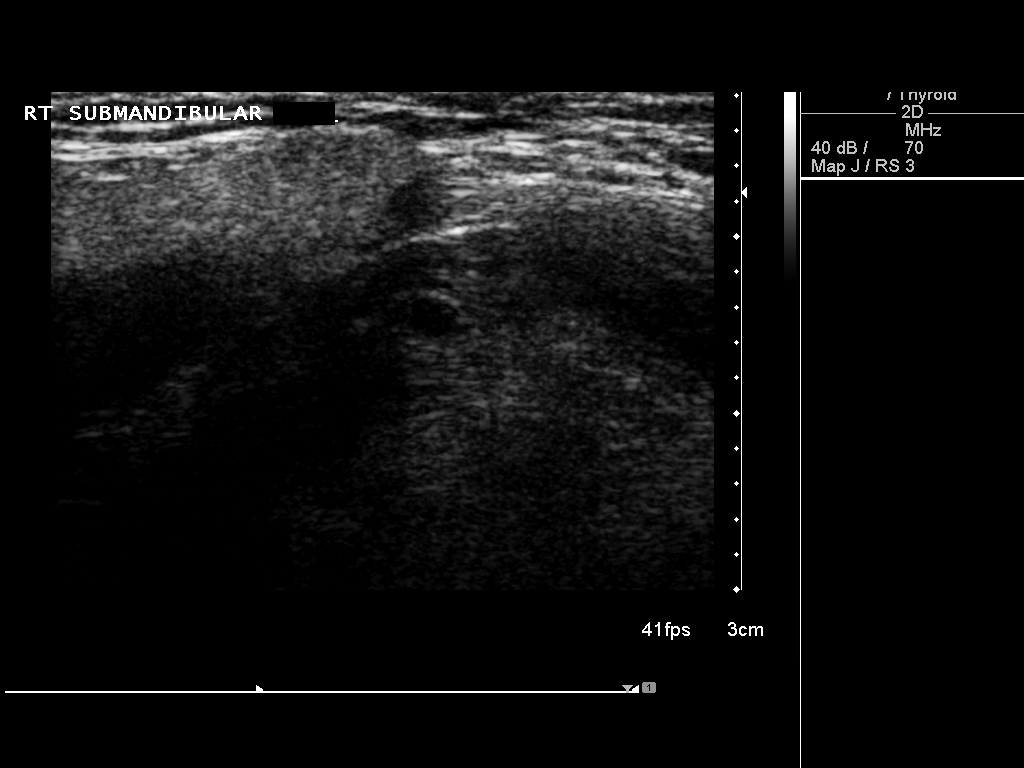
[im 3/5]
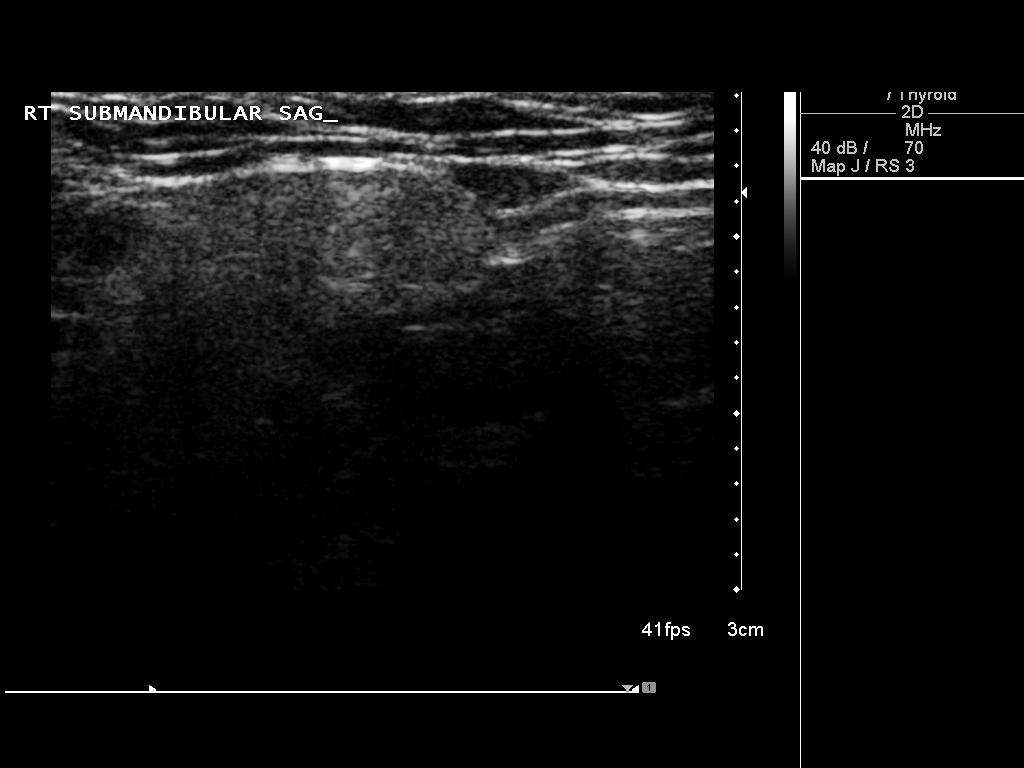
[im 4/5]
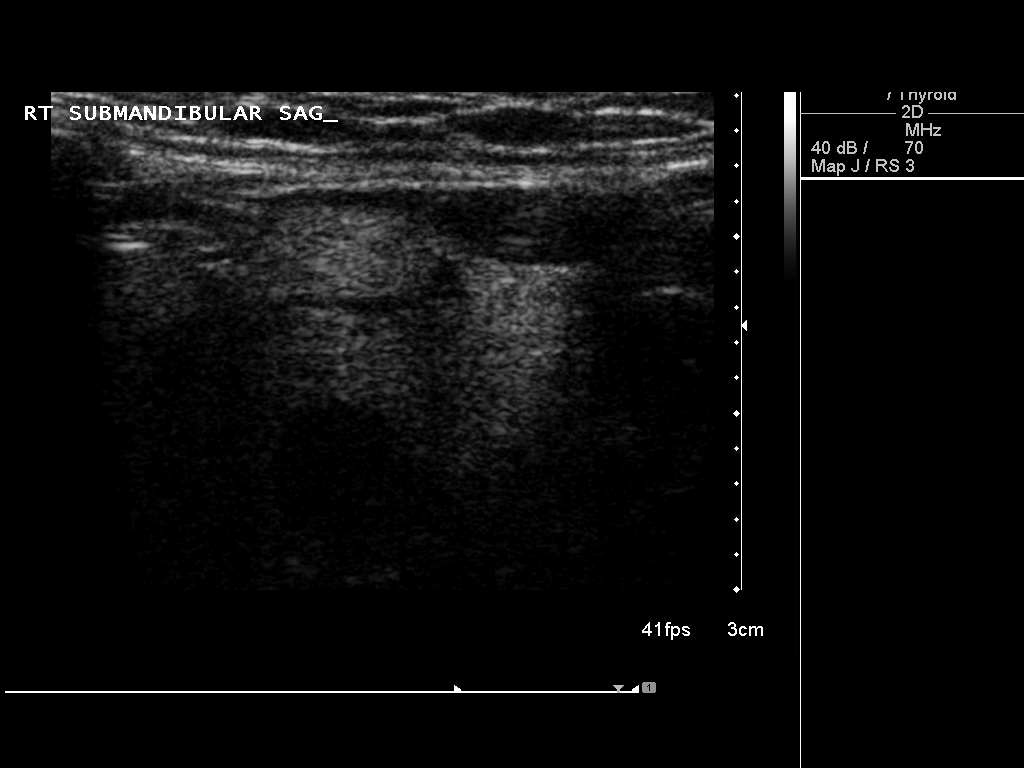
[im 5/5]
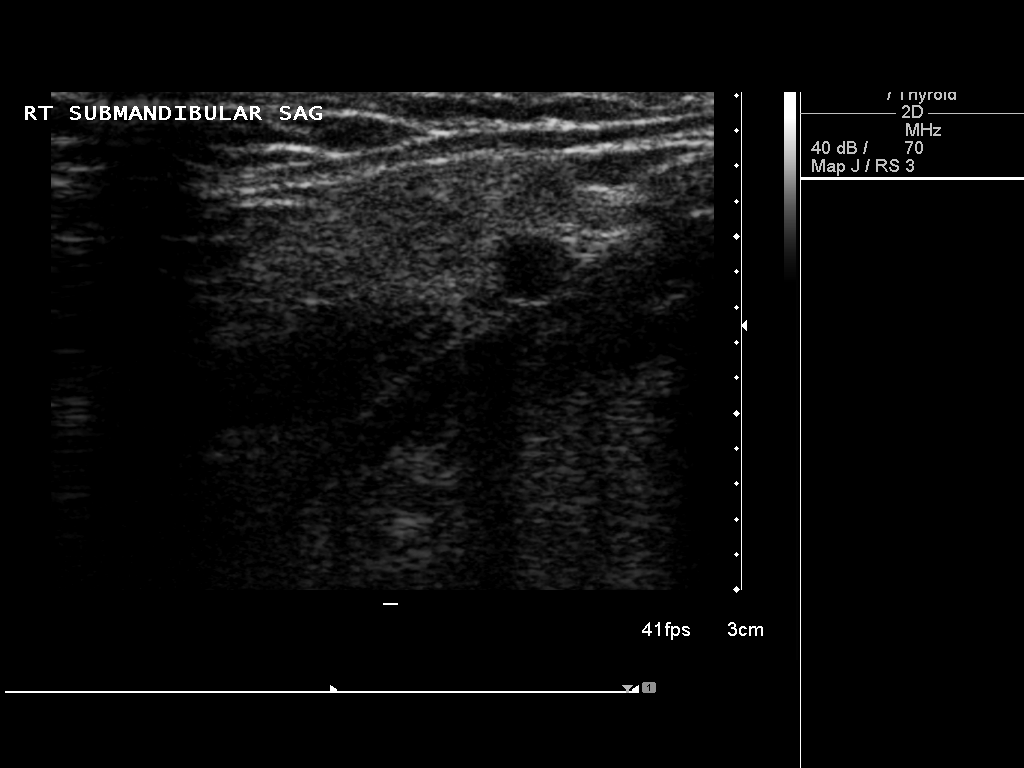

[5 of 5 positions shown; findings below may reference images not displayed]

FINDINGS: The complex cystic lesion in the submandibular area on
the right side has resolved.  No residual lesion is identified.  No
adenopathy.
IMPRESSION: Resolution of the complex cystic area in the right neck.

## 2012-10-26 ENCOUNTER — Ambulatory Visit (INDEPENDENT_AMBULATORY_CARE_PROVIDER_SITE_OTHER): Payer: BC Managed Care – PPO | Admitting: Psychology

## 2012-10-26 DIAGNOSIS — F411 Generalized anxiety disorder: Secondary | ICD-10-CM

## 2012-11-09 ENCOUNTER — Ambulatory Visit (INDEPENDENT_AMBULATORY_CARE_PROVIDER_SITE_OTHER): Payer: BC Managed Care – PPO | Admitting: Psychology

## 2012-11-09 DIAGNOSIS — F411 Generalized anxiety disorder: Secondary | ICD-10-CM

## 2012-11-18 ENCOUNTER — Ambulatory Visit (INDEPENDENT_AMBULATORY_CARE_PROVIDER_SITE_OTHER): Payer: BC Managed Care – PPO | Admitting: Psychology

## 2012-11-18 DIAGNOSIS — F411 Generalized anxiety disorder: Secondary | ICD-10-CM

## 2012-11-24 ENCOUNTER — Other Ambulatory Visit: Payer: Self-pay | Admitting: Internal Medicine

## 2012-11-28 ENCOUNTER — Ambulatory Visit (INDEPENDENT_AMBULATORY_CARE_PROVIDER_SITE_OTHER): Payer: BC Managed Care – PPO | Admitting: Psychology

## 2012-11-28 DIAGNOSIS — F411 Generalized anxiety disorder: Secondary | ICD-10-CM

## 2012-11-30 ENCOUNTER — Other Ambulatory Visit: Payer: BC Managed Care – PPO

## 2012-12-07 ENCOUNTER — Encounter: Payer: BC Managed Care – PPO | Admitting: Internal Medicine

## 2013-01-13 ENCOUNTER — Ambulatory Visit (INDEPENDENT_AMBULATORY_CARE_PROVIDER_SITE_OTHER): Payer: BC Managed Care – PPO | Admitting: Psychology

## 2013-01-13 DIAGNOSIS — F411 Generalized anxiety disorder: Secondary | ICD-10-CM

## 2013-01-20 ENCOUNTER — Ambulatory Visit (INDEPENDENT_AMBULATORY_CARE_PROVIDER_SITE_OTHER): Payer: BC Managed Care – PPO | Admitting: Psychology

## 2013-01-20 DIAGNOSIS — F411 Generalized anxiety disorder: Secondary | ICD-10-CM

## 2013-01-26 ENCOUNTER — Encounter: Payer: Self-pay | Admitting: Family Medicine

## 2013-01-26 ENCOUNTER — Ambulatory Visit (INDEPENDENT_AMBULATORY_CARE_PROVIDER_SITE_OTHER): Payer: BC Managed Care – PPO | Admitting: Family Medicine

## 2013-01-26 VITALS — BP 118/70 | Temp 98.2°F | Wt 177.0 lb

## 2013-01-26 DIAGNOSIS — L608 Other nail disorders: Secondary | ICD-10-CM

## 2013-01-26 DIAGNOSIS — L609 Nail disorder, unspecified: Secondary | ICD-10-CM

## 2013-01-26 DIAGNOSIS — L03039 Cellulitis of unspecified toe: Secondary | ICD-10-CM

## 2013-01-26 DIAGNOSIS — L03031 Cellulitis of right toe: Secondary | ICD-10-CM

## 2013-01-26 MED ORDER — MUPIROCIN 2 % EX OINT: TOPICAL_OINTMENT | Freq: Three times a day (TID) | CUTANEOUS | Status: DC

## 2013-01-26 NOTE — Patient Instructions (Signed)
Paronychia Paronychia is an inflammatory reaction involving the folds of the skin surrounding the fingernail. This is commonly caused by an infection in the skin around a nail. The most common cause of paronychia is frequent wetting of the hands (as seen with bartenders, food servers, nurses or others who wet their hands). This makes the skin around the fingernail susceptible to infection by bacteria (germs) or fungus. Other predisposing factors are:  Aggressive manicuring.  Nail biting.  Thumb sucking. The most common cause is a staphylococcal (a type of germ) infection, or a fungal (Candida) infection. When caused by a germ, it usually comes on suddenly with redness, swelling, pus and is often painful. It may get under the nail and form an abscess (collection of pus), or form an abscess around the nail. If the nail itself is infected with a fungus, the treatment is usually prolonged and may require oral medicine for up to one year. Your caregiver will determine the length of time treatment is required. The paronychia caused by bacteria (germs) may largely be avoided by not pulling on hangnails or picking at cuticles. When the infection occurs at the tips of the finger it is called felon. When the cause of paronychia is from the herpes simplex virus (HSV) it is called herpetic whitlow. TREATMENT  When an abscess is present treatment is often incision and drainage. This means that the abscess must be cut open so the pus can get out. When this is done, the following home care instructions should be followed. HOME CARE INSTRUCTIONS   It is important to keep the affected fingers very dry. Rubber or plastic gloves over cotton gloves should be used whenever the hand must be placed in water.  Keep wound clean, dry and dressed as suggested by your caregiver between warm soaks or warm compresses.  Soak in warm soapy water for 5-10 minutes 2-3 times per day for bacterial infections. Fungal infections are  very difficult to treat, so often require treatment for long periods of time.  For bacterial (germ) infections take antibiotics (medicine which kill germs) as directed and finish the prescription, even if the problem appears to be solved before the medicine is gone.  Only take over-the-counter or prescription medicines for pain, discomfort, or fever as directed by your caregiver. SEEK IMMEDIATE MEDICAL CARE IF:  You have worsening or not improving  You notice pus coming from the wound.  You have a fever.  You notice a bad smell coming from the wound or dressing. Document Released: 12/09/2000 Document Revised: 09/07/2011 Document Reviewed: 08/10/2008 [facility] Patient Information 2014 [place], [place].

## 2013-01-26 NOTE — Progress Notes (Signed)
Chief Complaint  Patient presents with  . right great toe infected  . boil on nose    HPI:  42 yo M pt of Dr. Lovell Sheehan here for acute visit for toe pain: -started 1 week ago -R great toe pain and redness and swelling near nail -denies: fevers, chills, drainage -has not had this before but did hit this toenail a month ago and ? Fungus being tx by PCP with vinegar ROS: See pertinent positives and negatives per HPI.  Past Medical History  Diagnosis Date  . Abdominal pain, generalized 06/10/2007  . Anxiety state, unspecified 06/06/2010  . CHEST WALL PAIN, ANTERIOR 10/11/2009  . CONSTIPATION, CHRONIC 06/10/2007  . CONSTIPATION 11/28/2007  . GASTROESOPHAGEAL REFLUX DISEASE, SEVERE 10/14/2007  . Herpes simplex without mention of complication 06/10/2007  . HYPERLIPIDEMIA, MILD, WITH LOW HDL 06/04/2009  . KNEE PAIN, LEFT, CHRONIC 05/02/2008  . LOC OSTEOARTHROS NOT SPEC PRIM/SEC LOWER LEG 07/15/2007  . THROMBOCYTHEMIA 02/04/2010    Family History  Problem Relation Age of Onset  . Kidney failure    . Kidney disease Mother   . Hypertension Mother   . Depression Mother   . Hyperlipidemia Mother   . Stroke Mother   . Diabetes Mother   . Heart disease Mother   . Irregular heart beat Father     History   Social History  . Marital Status: Married    Spouse Name: N/A    Number of Children: N/A  . 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 Smoking status: Never Smoker   . Smokeless tobacco: None  . Alcohol Use: 1.8 oz/week    1 Glasses of wine, 1 Cans of beer, 1 Shots of liquor per week  . Drug Use: No  . Sexually Active: Yes   Other Topics Concern  . None   Social History Narrative  . None    Current outpatient prescriptions:aspirin 81 MG tablet, Take 81 mg by mouth daily.  , Disp: , Rfl: ;  calcium citrate-vitamin D 200-200 MG-UNIT TABS, Take 1 tablet by mouth daily.  , Disp: , Rfl: ;  cetirizine (ZYRTEC) 10 MG tablet,  Take 10 mg by mouth daily., Disp: , Rfl: ;  finasteride (PROSCAR) 5 MG tablet, Take 1 tablet (5 mg total) by mouth daily., Disp: 30 tablet, Rfl: 11 mometasone (NASONEX) 50 MCG/ACT nasal spray, Place 2 sprays into the nose daily., Disp: , Rfl: ;  montelukast (SINGULAIR) 10 MG tablet, Take 10 mg by mouth at bedtime., Disp: , Rfl: ;  pantoprazole (PROTONIX) 20 MG tablet, Take 20 mg by mouth every other day. , Disp: , Rfl: ;  valACYclovir (VALTREX) 1000 MG tablet, TAKE 1/2 TABLET BY MOUTH EVERY DAY, Disp: 30 tablet, Rfl: 0 mupirocin ointment (BACTROBAN) 2 %, Apply topically 3 (three) times daily., Disp: 22 g, Rfl: 0  EXAM:  Filed Vitals:   01/26/13 0909  BP: 118/70  Temp: 98.2 F (36.8 C)    Body mass index is 22.72 kg/(m^2).  GENERAL: vitals reviewed and listed above, alert, oriented, appears well hydrated and in no acute distress  HEENT: atraumatic, conjunttiva clear, no obvious abnormalities on inspection of external nose and ears  MS: moves all extremities without noticeable abnormality  SKIN: R great toe with some discoloration deformity and thickening proximally with very mild amount of erythema and TTP around nail  PSYCH: pleasant and cooperative, no obvious depression or anxiety  ASSESSMENT AND PLAN:  Discussed the following assessment and plan:  Paronychia of great toe, right - Plan: mupirocin ointment (BACTROBAN) 2 %  Deformity of toenail  -discuss toe nail deformity could be trauma related or fungal and has mlid paronychia -tx with soaks and topical mupirocin -if not improving may need to see podiatry or do oral tx for onychomycosis -Patient advised to return or notify a doctor immediately if symptoms worsen or persist or new concerns arise.  Patient Instructions  Paronychia Paronychia is an inflammatory reaction involving the folds of the skin surrounding the fingernail. This is commonly caused by an infection in the skin around a nail. The most common cause of  paronychia is frequent wetting of the hands (as seen with bartenders, food servers, nurses or others who wet their hands). This makes the skin around the fingernail susceptible to infection by bacteria (germs) or fungus. Other predisposing factors are:  Aggressive manicuring.  Nail biting.  Thumb sucking. The most common cause is a staphylococcal (a type of germ) infection, or a fungal (Candida) infection. When caused by a germ, it usually comes on suddenly with redness, swelling, pus and is often painful. It may get under the nail and form an abscess (collection of pus), or form an abscess around the nail. If the nail itself is infected with a fungus, the treatment is usually prolonged and may require oral medicine for up to one year. Your caregiver will determine the length of time treatment is required. The paronychia caused by bacteria (germs) may largely be avoided by not pulling on hangnails or picking at cuticles. When the infection occurs at the tips of the finger it is called felon. When the cause of paronychia is from the herpes simplex virus (HSV) it is called herpetic whitlow. TREATMENT  When an abscess is present treatment is often incision and drainage. This means that the abscess must be cut open so the pus can get out. When this is done, the following home care instructions should be followed. HOME CARE INSTRUCTIONS   It is important to keep the affected fingers very dry. Rubber or plastic gloves over cotton gloves should be used whenever the hand must be placed in water.  Keep wound clean, dry and dressed as suggested by your caregiver between warm soaks or warm compresses.  Soak in warm soapy water for 5-10 minutes 2-3 times per day for bacterial infections. Fungal infections are very difficult to treat, so often require treatment for long periods of time.  For bacterial (germ) infections take antibiotics (medicine which kill germs) as directed and finish the prescription, even if  the problem appears to be solved before the medicine is gone.  Only take over-the-counter or prescription medicines for pain, discomfort, or fever as directed by your caregiver. SEEK IMMEDIATE MEDICAL CARE IF:  You have worsening or not improving  You notice pus coming from the wound.  You have a fever.  You notice a bad smell coming from the wound or dressing. Document Released: 12/09/2000 Document Revised: 09/07/2011 Document Reviewed: 08/10/2008 [facility] Patient Information 2014 [place], Lona Kettle, Dahlia Client R.

## 2013-01-27 ENCOUNTER — Other Ambulatory Visit: Payer: Self-pay | Admitting: Internal Medicine

## 2013-01-27 MED ORDER — VALACYCLOVIR HCL 1 G PO TABS: ORAL_TABLET | ORAL | Status: DC

## 2013-01-30 ENCOUNTER — Ambulatory Visit (INDEPENDENT_AMBULATORY_CARE_PROVIDER_SITE_OTHER): Payer: BC Managed Care – PPO | Admitting: Psychology

## 2013-01-30 DIAGNOSIS — F411 Generalized anxiety disorder: Secondary | ICD-10-CM

## 2013-02-03 ENCOUNTER — Telehealth: Payer: Self-pay | Admitting: Internal Medicine

## 2013-02-03 DIAGNOSIS — L03039 Cellulitis of unspecified toe: Secondary | ICD-10-CM

## 2013-02-03 NOTE — Telephone Encounter (Signed)
Referral placed.

## 2013-02-03 NOTE — Telephone Encounter (Signed)
Pt was seen by Dr Selena Batten for toe infection and told he may need referral to podiatrist. Pt would like to do this. Pt still has foot fungus and states it looks bad..pls advise.

## 2013-02-10 ENCOUNTER — Ambulatory Visit: Payer: BC Managed Care – PPO | Admitting: Psychology

## 2013-02-14 ENCOUNTER — Ambulatory Visit (INDEPENDENT_AMBULATORY_CARE_PROVIDER_SITE_OTHER): Payer: BC Managed Care – PPO | Admitting: Psychology

## 2013-02-14 DIAGNOSIS — F411 Generalized anxiety disorder: Secondary | ICD-10-CM

## 2013-02-24 ENCOUNTER — Ambulatory Visit (INDEPENDENT_AMBULATORY_CARE_PROVIDER_SITE_OTHER): Payer: BC Managed Care – PPO | Admitting: Psychology

## 2013-02-24 DIAGNOSIS — F411 Generalized anxiety disorder: Secondary | ICD-10-CM

## 2013-03-17 ENCOUNTER — Ambulatory Visit (INDEPENDENT_AMBULATORY_CARE_PROVIDER_SITE_OTHER): Payer: BC Managed Care – PPO | Admitting: Psychology

## 2013-03-17 DIAGNOSIS — F411 Generalized anxiety disorder: Secondary | ICD-10-CM

## 2013-03-31 ENCOUNTER — Ambulatory Visit (INDEPENDENT_AMBULATORY_CARE_PROVIDER_SITE_OTHER): Payer: BC Managed Care – PPO | Admitting: Psychology

## 2013-03-31 DIAGNOSIS — F411 Generalized anxiety disorder: Secondary | ICD-10-CM

## 2013-04-07 ENCOUNTER — Ambulatory Visit (INDEPENDENT_AMBULATORY_CARE_PROVIDER_SITE_OTHER): Payer: BC Managed Care – PPO | Admitting: Psychology

## 2013-04-07 DIAGNOSIS — F411 Generalized anxiety disorder: Secondary | ICD-10-CM

## 2013-04-21 ENCOUNTER — Other Ambulatory Visit (INDEPENDENT_AMBULATORY_CARE_PROVIDER_SITE_OTHER): Payer: BC Managed Care – PPO

## 2013-04-21 ENCOUNTER — Ambulatory Visit (INDEPENDENT_AMBULATORY_CARE_PROVIDER_SITE_OTHER): Payer: BC Managed Care – PPO | Admitting: Psychology

## 2013-04-21 DIAGNOSIS — Z0000 Encounter for general adult medical examination without abnormal findings: Secondary | ICD-10-CM

## 2013-04-21 DIAGNOSIS — F411 Generalized anxiety disorder: Secondary | ICD-10-CM

## 2013-04-21 LAB — HEPATIC FUNCTION PANEL
AST: 15 U/L (ref 0–37)
Albumin: 4.1 g/dL (ref 3.5–5.2)
Alkaline Phosphatase: 37 U/L — ABNORMAL LOW (ref 39–117)
Bilirubin, Direct: 0.1 mg/dL (ref 0.0–0.3)
Total Protein: 6.7 g/dL (ref 6.0–8.3)

## 2013-04-21 LAB — CBC WITH DIFFERENTIAL/PLATELET
Basophils Relative: 0.6 % (ref 0.0–3.0)
Eosinophils Absolute: 0.2 10*3/uL (ref 0.0–0.7)
MCHC: 34.9 g/dL (ref 30.0–36.0)
MCV: 96 fl (ref 78.0–100.0)
Monocytes Absolute: 0.3 10*3/uL (ref 0.1–1.0)
Neutrophils Relative %: 56.8 % (ref 43.0–77.0)
Platelets: 126 10*3/uL — ABNORMAL LOW (ref 150.0–400.0)
RBC: 4.47 Mil/uL (ref 4.22–5.81)
RDW: 13.8 % (ref 11.5–14.6)

## 2013-04-21 LAB — LIPID PANEL
Cholesterol: 148 mg/dL (ref 0–200)
HDL: 52.6 mg/dL
LDL Cholesterol: 85 mg/dL (ref 0–99)
Total CHOL/HDL Ratio: 3
Triglycerides: 50 mg/dL (ref 0.0–149.0)
VLDL: 10 mg/dL (ref 0.0–40.0)

## 2013-04-21 LAB — POCT URINALYSIS DIPSTICK
Bilirubin, UA: NEGATIVE
Blood, UA: NEGATIVE
Glucose, UA: NEGATIVE
Ketones, UA: NEGATIVE
Leukocytes, UA: NEGATIVE
Nitrite, UA: NEGATIVE
Protein, UA: NEGATIVE
Spec Grav, UA: 1.015
Urobilinogen, UA: 0.2
pH, UA: 7.5

## 2013-04-21 LAB — BASIC METABOLIC PANEL
BUN: 16 mg/dL (ref 6–23)
GFR: 68.4 mL/min (ref >60.00)
Potassium: 4.1 mEq/L (ref 3.5–5.1)

## 2013-04-28 ENCOUNTER — Encounter: Payer: Self-pay | Admitting: Internal Medicine

## 2013-04-28 ENCOUNTER — Ambulatory Visit (INDEPENDENT_AMBULATORY_CARE_PROVIDER_SITE_OTHER): Payer: BC Managed Care – PPO | Admitting: Internal Medicine

## 2013-04-28 VITALS — BP 116/70 | HR 56 | Temp 97.9°F | Resp 14 | Ht 74.0 in | Wt 175.0 lb

## 2013-04-28 DIAGNOSIS — Z0000 Encounter for general adult medical examination without abnormal findings: Secondary | ICD-10-CM

## 2013-04-28 DIAGNOSIS — Z23 Encounter for immunization: Secondary | ICD-10-CM

## 2013-04-28 NOTE — Progress Notes (Signed)
Subjective:    Patient ID: Keith Benton, male    DOB: 1971-01-13, 42 y.o.   MRN: [id number]  HPI Stable mild URI symptoms for 2-3 days Mild OA Still plays basketball and walks Did not take the prostate medications due to uncertainly about children and BC Increased urination  Review of Systems  Constitutional: Negative for fever and fatigue.  HENT: Negative for congestion, hearing loss and postnasal drip.   Eyes: Negative for discharge, redness and visual disturbance.  Respiratory: Negative for cough, shortness of breath and wheezing.   Cardiovascular: Negative for leg swelling.  Gastrointestinal: Negative for abdominal pain, constipation and abdominal distention.  Genitourinary: Negative for urgency and frequency.  Musculoskeletal: Negative for arthralgias, joint swelling and neck pain.  Skin: Negative for color change and rash.  Neurological: Negative for weakness and light-headedness.  Hematological: Negative for adenopathy.  Psychiatric/Behavioral: Negative for behavioral problems.   Past Medical History  Diagnosis Date  . Abdominal pain, generalized 06/10/2007  . Anxiety state, unspecified 06/06/2010  . CHEST WALL PAIN, ANTERIOR 10/11/2009  . CONSTIPATION, CHRONIC 06/10/2007  . CONSTIPATION 11/28/2007  . GASTROESOPHAGEAL REFLUX DISEASE, SEVERE 10/14/2007  . Herpes simplex without mention of complication 06/10/2007  . HYPERLIPIDEMIA, MILD, WITH LOW HDL 06/04/2009  . KNEE PAIN, LEFT, CHRONIC 05/02/2008  . LOC OSTEOARTHROS NOT SPEC PRIM/SEC LOWER LEG 07/15/2007  . THROMBOCYTHEMIA 02/04/2010    History   Social History  . Marital Status: Married    Spouse Name: N/A    Number of Children: N/A  . 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 Smoking status: Never Smoker   . Smokeless tobacco: Not on file  . Alcohol Use: 1.8 oz/week    1 Glasses of wine, 1 Cans of beer, 1 Shots of liquor per week  . Drug Use: No  .  Sexual Activity: Yes   Other Topics Concern  . Not on file   Social History Narrative  . No narrative on file    Past Surgical History  Procedure Laterality Date  . Fibroid cyst removed from neck    . Anterior cruciate ligament repair  2001    right    Family History  Problem Relation Age of Onset  . Kidney failure    . Kidney disease Mother   . Hypertension Mother   . Depression Mother   . Hyperlipidemia Mother   . Stroke Mother   . Diabetes Mother   . Heart disease Mother   . Irregular heart beat Father     No Known Allergies  Current Outpatient Prescriptions on File Prior to Visit  Medication Sig Dispense Refill  . aspirin 81 MG tablet Take 81 mg by mouth daily.        . calcium citrate-vitamin D 200-200 MG-UNIT TABS Take 1 tablet by mouth daily.        . cetirizine (ZYRTEC) 10 MG tablet Take 10 mg by mouth daily.      . mometasone (NASONEX) 50 MCG/ACT nasal spray Place 2 sprays into the nose daily.      . montelukast (SINGULAIR) 10 MG tablet Take 10 mg by mouth at bedtime.      . valACYclovir (VALTREX) 1000 MG tablet TAKE 1/2 TABLET BY MOUTH EVERY DAY  30 tablet  3   No current facility-administered medications on file prior to visit.    BP 116/70  Pulse 56  Temp(Src) 97.9 F (36.6 C)  Resp 14  Ht 6\' 2"  (1.88 m)  Wt 175 lb (79.379 kg)  BMI 22.46 kg/m2        Objective:   Physical Exam  Constitutional: He appears well-developed and well-nourished.  HENT:  Head: Normocephalic and atraumatic.  Eyes: Conjunctivae are normal. Pupils are equal, round, and reactive to light.  Neck: Normal range of motion. Neck supple.  Cardiovascular: Normal rate and regular rhythm.   Pulmonary/Chest: Effort normal and breath sounds normal.  Abdominal: Soft. Bowel sounds are normal.  Genitourinary: Rectum normal and prostate normal.  Slightly enlarged prostate          Assessment & Plan:   Patient presents for yearly preventative medicine examination.   all  immunizations and health maintenance protocols were reviewed with the patient and they are up to date with these protocols.   screening laboratory values were reviewed with the patient including screening of hyperlipidemia PSA renal function and hepatic function.   There medications past medical history social history problem list and allergies were reviewed in detail.   Goals were established with regard to weight loss exercise diet in compliance with medications  Discussed the use of a fruit laxative such as prunes versus a fiber supplement for control of irritable bowel syndromes.  Discussed mild BPH symptomatology and the use of Proscar

## 2013-05-05 ENCOUNTER — Ambulatory Visit (INDEPENDENT_AMBULATORY_CARE_PROVIDER_SITE_OTHER): Payer: BC Managed Care – PPO | Admitting: Psychology

## 2013-05-05 DIAGNOSIS — F411 Generalized anxiety disorder: Secondary | ICD-10-CM

## 2013-05-09 ENCOUNTER — Telehealth: Payer: Self-pay | Admitting: Internal Medicine

## 2013-05-09 NOTE — Telephone Encounter (Signed)
He may come in 1 week early prior for a cbc and iron. The diagnoses code is 238.71

## 2013-05-09 NOTE — Telephone Encounter (Signed)
lmom for pt to sch °

## 2013-05-09 NOTE — Telephone Encounter (Signed)
Pt states he needs labs concerning platelet count done prior to fup 11/03/13. Is it ok to schedule?

## 2013-05-09 NOTE — Telephone Encounter (Signed)
He may co me in 1 week prior for cbc,iron

## 2013-05-10 NOTE — Telephone Encounter (Signed)
Also have folate level drawn

## 2013-05-10 NOTE — Telephone Encounter (Signed)
lmom/kh 

## 2013-05-11 NOTE — Telephone Encounter (Signed)
Done kh

## 2013-06-02 ENCOUNTER — Ambulatory Visit (INDEPENDENT_AMBULATORY_CARE_PROVIDER_SITE_OTHER): Payer: BC Managed Care – PPO | Admitting: Psychology

## 2013-06-02 DIAGNOSIS — F411 Generalized anxiety disorder: Secondary | ICD-10-CM

## 2013-06-13 ENCOUNTER — Ambulatory Visit (INDEPENDENT_AMBULATORY_CARE_PROVIDER_SITE_OTHER): Payer: BC Managed Care – PPO | Admitting: Psychology

## 2013-06-13 DIAGNOSIS — F411 Generalized anxiety disorder: Secondary | ICD-10-CM

## 2013-06-26 ENCOUNTER — Ambulatory Visit (INDEPENDENT_AMBULATORY_CARE_PROVIDER_SITE_OTHER): Payer: BC Managed Care – PPO | Admitting: Psychology

## 2013-06-26 DIAGNOSIS — F411 Generalized anxiety disorder: Secondary | ICD-10-CM

## 2013-07-04 ENCOUNTER — Ambulatory Visit (INDEPENDENT_AMBULATORY_CARE_PROVIDER_SITE_OTHER): Payer: BC Managed Care – PPO | Admitting: Psychology

## 2013-07-04 DIAGNOSIS — F411 Generalized anxiety disorder: Secondary | ICD-10-CM

## 2013-07-19 ENCOUNTER — Ambulatory Visit (INDEPENDENT_AMBULATORY_CARE_PROVIDER_SITE_OTHER): Payer: BC Managed Care – PPO | Admitting: Psychology

## 2013-07-19 DIAGNOSIS — F411 Generalized anxiety disorder: Secondary | ICD-10-CM

## 2013-07-28 ENCOUNTER — Ambulatory Visit (INDEPENDENT_AMBULATORY_CARE_PROVIDER_SITE_OTHER): Payer: BC Managed Care – PPO | Admitting: Psychology

## 2013-07-28 DIAGNOSIS — F411 Generalized anxiety disorder: Secondary | ICD-10-CM

## 2013-08-04 ENCOUNTER — Ambulatory Visit (INDEPENDENT_AMBULATORY_CARE_PROVIDER_SITE_OTHER): Payer: BC Managed Care – PPO | Admitting: Psychology

## 2013-08-04 DIAGNOSIS — F411 Generalized anxiety disorder: Secondary | ICD-10-CM

## 2013-08-14 ENCOUNTER — Ambulatory Visit (INDEPENDENT_AMBULATORY_CARE_PROVIDER_SITE_OTHER): Payer: BC Managed Care – PPO | Admitting: Psychology

## 2013-08-14 DIAGNOSIS — F411 Generalized anxiety disorder: Secondary | ICD-10-CM

## 2013-08-25 ENCOUNTER — Ambulatory Visit: Payer: BC Managed Care – PPO | Admitting: Psychology

## 2013-08-28 ENCOUNTER — Ambulatory Visit (INDEPENDENT_AMBULATORY_CARE_PROVIDER_SITE_OTHER): Payer: BC Managed Care – PPO | Admitting: Psychology

## 2013-08-28 DIAGNOSIS — F411 Generalized anxiety disorder: Secondary | ICD-10-CM

## 2013-09-11 ENCOUNTER — Ambulatory Visit (INDEPENDENT_AMBULATORY_CARE_PROVIDER_SITE_OTHER): Payer: BC Managed Care – PPO | Admitting: Psychology

## 2013-09-11 DIAGNOSIS — F411 Generalized anxiety disorder: Secondary | ICD-10-CM

## 2013-09-19 ENCOUNTER — Ambulatory Visit (INDEPENDENT_AMBULATORY_CARE_PROVIDER_SITE_OTHER): Payer: BC Managed Care – PPO | Admitting: Psychology

## 2013-09-19 DIAGNOSIS — F411 Generalized anxiety disorder: Secondary | ICD-10-CM

## 2013-09-29 ENCOUNTER — Ambulatory Visit (INDEPENDENT_AMBULATORY_CARE_PROVIDER_SITE_OTHER): Payer: BC Managed Care – PPO | Admitting: Psychology

## 2013-09-29 DIAGNOSIS — F411 Generalized anxiety disorder: Secondary | ICD-10-CM

## 2013-10-10 ENCOUNTER — Ambulatory Visit (INDEPENDENT_AMBULATORY_CARE_PROVIDER_SITE_OTHER): Payer: BC Managed Care – PPO | Admitting: Psychology

## 2013-10-10 DIAGNOSIS — F411 Generalized anxiety disorder: Secondary | ICD-10-CM

## 2013-10-18 ENCOUNTER — Ambulatory Visit (INDEPENDENT_AMBULATORY_CARE_PROVIDER_SITE_OTHER): Payer: BC Managed Care – PPO | Admitting: Psychology

## 2013-10-18 DIAGNOSIS — F411 Generalized anxiety disorder: Secondary | ICD-10-CM

## 2013-10-20 ENCOUNTER — Other Ambulatory Visit: Payer: Self-pay | Admitting: Internal Medicine

## 2013-10-23 ENCOUNTER — Other Ambulatory Visit (INDEPENDENT_AMBULATORY_CARE_PROVIDER_SITE_OTHER): Payer: BC Managed Care – PPO

## 2013-10-23 DIAGNOSIS — E785 Hyperlipidemia, unspecified: Secondary | ICD-10-CM

## 2013-10-23 DIAGNOSIS — D473 Essential (hemorrhagic) thrombocythemia: Secondary | ICD-10-CM

## 2013-10-23 LAB — CBC WITH DIFFERENTIAL/PLATELET
BASOS ABS: 0 10*3/uL (ref 0.0–0.1)
Basophils Relative: 0.7 % (ref 0.0–3.0)
EOS ABS: 0.3 10*3/uL (ref 0.0–0.7)
Eosinophils Relative: 4.4 % (ref 0.0–5.0)
HCT: 41.4 % (ref 39.0–52.0)
HEMOGLOBIN: 14.3 g/dL (ref 13.0–17.0)
LYMPHS PCT: 28.9 % (ref 12.0–46.0)
Lymphs Abs: 1.7 10*3/uL (ref 0.7–4.0)
MCHC: 34.6 g/dL (ref 30.0–36.0)
MCV: 97.4 fl (ref 78.0–100.0)
Monocytes Absolute: 0.4 10*3/uL (ref 0.1–1.0)
Monocytes Relative: 6.9 % (ref 3.0–12.0)
NEUTROS ABS: 3.4 10*3/uL (ref 1.4–7.7)
Neutrophils Relative %: 59.1 % (ref 43.0–77.0)
PLATELETS: 130 10*3/uL — AB (ref 150.0–400.0)
RBC: 4.25 Mil/uL (ref 4.22–5.81)
RDW: 12.5 % (ref 11.5–14.6)
WBC: 5.8 10*3/uL (ref 4.5–10.5)

## 2013-10-23 LAB — IRON: Iron: 87 ug/dL (ref 42–165)

## 2013-10-23 LAB — LIPID PANEL
Cholesterol: 151 mg/dL (ref 0–200)
HDL: 57.3 mg/dL (ref >39.00)
LDL CALC: 86 mg/dL (ref 0–99)
TRIGLYCERIDES: 38 mg/dL (ref 0.0–149.0)
Total CHOL/HDL Ratio: 3
VLDL: 7.6 mg/dL (ref 0.0–40.0)

## 2013-10-23 LAB — FOLATE: Folate: 16.1 ng/mL (ref >5.9)

## 2013-10-27 ENCOUNTER — Ambulatory Visit: Payer: BC Managed Care – PPO | Admitting: Internal Medicine

## 2013-11-02 ENCOUNTER — Ambulatory Visit (INDEPENDENT_AMBULATORY_CARE_PROVIDER_SITE_OTHER): Payer: BC Managed Care – PPO | Admitting: Psychology

## 2013-11-02 DIAGNOSIS — F411 Generalized anxiety disorder: Secondary | ICD-10-CM

## 2013-11-03 ENCOUNTER — Ambulatory Visit (INDEPENDENT_AMBULATORY_CARE_PROVIDER_SITE_OTHER): Payer: BC Managed Care – PPO | Admitting: Internal Medicine

## 2013-11-03 ENCOUNTER — Encounter: Payer: Self-pay | Admitting: Internal Medicine

## 2013-11-03 VITALS — BP 108/70 | HR 88 | Temp 97.9°F | Wt 179.0 lb

## 2013-11-03 DIAGNOSIS — D473 Essential (hemorrhagic) thrombocythemia: Secondary | ICD-10-CM

## 2013-11-03 NOTE — Patient Instructions (Signed)
The patient is instructed to continue all medications as prescribed. Schedule followup with check out clerk upon leaving the clinic  

## 2013-11-03 NOTE — Progress Notes (Signed)
Pre visit review using our clinic review tool, if applicable. No additional management support is needed unless otherwise documented below in the visit note. 

## 2013-11-03 NOTE — Progress Notes (Signed)
   Subjective:    Patient ID: Shahil Speegle, male    DOB: 04-30-1971, 43 y.o.   MRN: [id number]  HPI  Lipid paleo diet  Monitoring  History of low platlets   Review of Systems  Constitutional: Negative for fever and fatigue.  HENT: Negative for congestion, hearing loss and postnasal drip.   Eyes: Negative for discharge, redness and visual disturbance.  Respiratory: Negative for cough, shortness of breath and wheezing.   Cardiovascular: Negative for leg swelling.  Gastrointestinal: Negative for abdominal pain, constipation and abdominal distention.  Genitourinary: Negative for urgency and frequency.  Musculoskeletal: Negative for arthralgias, joint swelling and neck pain.  Skin: Negative for color change and rash.  Neurological: Negative for weakness and light-headedness.  Hematological: Negative for adenopathy.  Psychiatric/Behavioral: Negative for behavioral problems.       Objective:   Physical Exam  Constitutional: He appears well-developed and well-nourished.  HENT:  Head: Normocephalic and atraumatic.  Eyes: Conjunctivae are normal. Pupils are equal, round, and reactive to light.  Neck: Normal range of motion. Neck supple.  Cardiovascular: Normal rate and regular rhythm.   Pulmonary/Chest: Effort normal and breath sounds normal.  Abdominal: Soft. Bowel sounds are normal.          Assessment & Plan:  Idiopathic thrombocythemia  Stable  CPX in 12 month  Call DR Yong Channel

## 2013-11-10 ENCOUNTER — Ambulatory Visit (INDEPENDENT_AMBULATORY_CARE_PROVIDER_SITE_OTHER): Payer: BC Managed Care – PPO | Admitting: Psychology

## 2013-11-10 DIAGNOSIS — F411 Generalized anxiety disorder: Secondary | ICD-10-CM

## 2013-11-27 ENCOUNTER — Ambulatory Visit (INDEPENDENT_AMBULATORY_CARE_PROVIDER_SITE_OTHER): Payer: BC Managed Care – PPO | Admitting: Psychology

## 2013-11-27 DIAGNOSIS — F411 Generalized anxiety disorder: Secondary | ICD-10-CM

## 2013-12-05 ENCOUNTER — Ambulatory Visit (INDEPENDENT_AMBULATORY_CARE_PROVIDER_SITE_OTHER): Payer: BC Managed Care – PPO | Admitting: Psychology

## 2013-12-05 DIAGNOSIS — F411 Generalized anxiety disorder: Secondary | ICD-10-CM

## 2013-12-12 ENCOUNTER — Ambulatory Visit (INDEPENDENT_AMBULATORY_CARE_PROVIDER_SITE_OTHER): Payer: BC Managed Care – PPO | Admitting: Psychology

## 2013-12-12 DIAGNOSIS — F411 Generalized anxiety disorder: Secondary | ICD-10-CM

## 2013-12-19 ENCOUNTER — Ambulatory Visit (INDEPENDENT_AMBULATORY_CARE_PROVIDER_SITE_OTHER): Payer: BC Managed Care – PPO | Admitting: Psychology

## 2013-12-19 DIAGNOSIS — F411 Generalized anxiety disorder: Secondary | ICD-10-CM

## 2014-01-08 ENCOUNTER — Ambulatory Visit (INDEPENDENT_AMBULATORY_CARE_PROVIDER_SITE_OTHER): Payer: BC Managed Care – PPO | Admitting: Psychology

## 2014-01-08 DIAGNOSIS — F411 Generalized anxiety disorder: Secondary | ICD-10-CM

## 2014-01-09 ENCOUNTER — Ambulatory Visit (INDEPENDENT_AMBULATORY_CARE_PROVIDER_SITE_OTHER): Payer: BC Managed Care – PPO | Admitting: Psychology

## 2014-01-09 DIAGNOSIS — F411 Generalized anxiety disorder: Secondary | ICD-10-CM

## 2014-02-23 ENCOUNTER — Ambulatory Visit: Payer: BC Managed Care – PPO | Admitting: Psychology

## 2014-04-20 ENCOUNTER — Ambulatory Visit (INDEPENDENT_AMBULATORY_CARE_PROVIDER_SITE_OTHER): Payer: BC Managed Care – PPO | Admitting: Psychology

## 2014-04-20 DIAGNOSIS — F411 Generalized anxiety disorder: Secondary | ICD-10-CM

## 2014-05-11 ENCOUNTER — Ambulatory Visit: Payer: BC Managed Care – PPO | Admitting: Psychology

## 2014-05-16 ENCOUNTER — Ambulatory Visit: Payer: BC Managed Care – PPO | Admitting: Psychology

## 2014-07-03 ENCOUNTER — Ambulatory Visit (INDEPENDENT_AMBULATORY_CARE_PROVIDER_SITE_OTHER): Payer: BC Managed Care – PPO | Admitting: Psychology

## 2014-07-03 DIAGNOSIS — F411 Generalized anxiety disorder: Secondary | ICD-10-CM

## 2014-07-18 ENCOUNTER — Ambulatory Visit: Payer: BC Managed Care – PPO | Admitting: Psychology

## 2014-07-27 ENCOUNTER — Ambulatory Visit: Payer: BC Managed Care – PPO | Admitting: Psychology

## 2014-08-06 ENCOUNTER — Ambulatory Visit (INDEPENDENT_AMBULATORY_CARE_PROVIDER_SITE_OTHER): Payer: BC Managed Care – PPO | Admitting: Psychology

## 2014-08-06 DIAGNOSIS — F411 Generalized anxiety disorder: Secondary | ICD-10-CM

## 2014-08-24 ENCOUNTER — Ambulatory Visit: Payer: BC Managed Care – PPO | Admitting: Psychology

## 2014-08-31 ENCOUNTER — Ambulatory Visit: Payer: BC Managed Care – PPO | Admitting: Psychology

## 2014-09-05 ENCOUNTER — Ambulatory Visit: Payer: BC Managed Care – PPO | Admitting: Psychology

## 2014-09-14 ENCOUNTER — Ambulatory Visit: Payer: BC Managed Care – PPO | Admitting: Psychology

## 2014-10-03 ENCOUNTER — Ambulatory Visit: Payer: BC Managed Care – PPO | Admitting: Psychology

## 2014-10-11 ENCOUNTER — Ambulatory Visit: Payer: BC Managed Care – PPO | Admitting: Psychology

## 2014-10-12 ENCOUNTER — Ambulatory Visit: Payer: BC Managed Care – PPO | Admitting: Psychology

## 2014-10-26 ENCOUNTER — Ambulatory Visit: Payer: BC Managed Care – PPO | Admitting: Psychology

## 2014-11-02 ENCOUNTER — Ambulatory Visit: Payer: BC Managed Care – PPO | Admitting: Psychology

## 2014-11-09 ENCOUNTER — Ambulatory Visit: Payer: BC Managed Care – PPO | Admitting: Psychology

## 2014-11-27 ENCOUNTER — Ambulatory Visit: Payer: BC Managed Care – PPO | Admitting: Psychology

## 2014-12-05 ENCOUNTER — Ambulatory Visit (INDEPENDENT_AMBULATORY_CARE_PROVIDER_SITE_OTHER): Payer: BC Managed Care – PPO | Admitting: Psychology

## 2014-12-05 DIAGNOSIS — F411 Generalized anxiety disorder: Secondary | ICD-10-CM

## 2014-12-11 ENCOUNTER — Ambulatory Visit (INDEPENDENT_AMBULATORY_CARE_PROVIDER_SITE_OTHER): Payer: BC Managed Care – PPO | Admitting: Psychology

## 2014-12-11 DIAGNOSIS — F411 Generalized anxiety disorder: Secondary | ICD-10-CM

## 2015-06-14 ENCOUNTER — Encounter: Payer: Self-pay | Admitting: Internal Medicine
